# Patient Record
Sex: Female | Born: 1935 | Race: White | Hispanic: No | Smoking: Current every day smoker
Health system: Southern US, Community
[De-identification: ages and names within clinical notes are randomized; demographics above are authoritative.]

## PROBLEM LIST (undated history)

## (undated) DIAGNOSIS — I739 Peripheral vascular disease, unspecified: Secondary | ICD-10-CM

## (undated) DIAGNOSIS — H269 Unspecified cataract: Secondary | ICD-10-CM

## (undated) DIAGNOSIS — H9190 Unspecified hearing loss, unspecified ear: Secondary | ICD-10-CM

## (undated) DIAGNOSIS — J302 Other seasonal allergic rhinitis: Secondary | ICD-10-CM

## (undated) DIAGNOSIS — M1990 Unspecified osteoarthritis, unspecified site: Secondary | ICD-10-CM

## (undated) DIAGNOSIS — I447 Left bundle-branch block, unspecified: Secondary | ICD-10-CM

## (undated) DIAGNOSIS — H3530 Unspecified macular degeneration: Secondary | ICD-10-CM

## (undated) HISTORY — DX: Unspecified cataract: H26.9

## (undated) HISTORY — PX: NO PAST SURGERIES: SHX2092

## (undated) HISTORY — PX: WRIST SURGERY: SHX841

## (undated) HISTORY — DX: Other seasonal allergic rhinitis: J30.2

## (undated) HISTORY — DX: Unspecified osteoarthritis, unspecified site: M19.90

## (undated) HISTORY — DX: Peripheral vascular disease, unspecified: I73.9

---

## 1998-05-13 ENCOUNTER — Ambulatory Visit (HOSPITAL_COMMUNITY): Admission: RE | Admit: 1998-05-13 | Discharge: 1998-05-13 | Payer: Self-pay | Admitting: General Surgery

## 1998-05-19 ENCOUNTER — Other Ambulatory Visit: Admission: RE | Admit: 1998-05-19 | Discharge: 1998-05-19 | Payer: Self-pay | Admitting: General Surgery

## 1998-11-03 ENCOUNTER — Encounter: Payer: Self-pay | Admitting: General Surgery

## 1998-11-03 ENCOUNTER — Ambulatory Visit (HOSPITAL_COMMUNITY): Admission: RE | Admit: 1998-11-03 | Discharge: 1998-11-03 | Payer: Self-pay | Admitting: General Surgery

## 1998-11-29 ENCOUNTER — Other Ambulatory Visit: Admission: RE | Admit: 1998-11-29 | Discharge: 1998-11-29 | Payer: Self-pay | Admitting: Obstetrics and Gynecology

## 1999-11-06 ENCOUNTER — Encounter: Payer: Self-pay | Admitting: General Surgery

## 1999-11-06 ENCOUNTER — Ambulatory Visit (HOSPITAL_COMMUNITY): Admission: RE | Admit: 1999-11-06 | Discharge: 1999-11-06 | Payer: Self-pay | Admitting: General Surgery

## 2000-01-07 ENCOUNTER — Other Ambulatory Visit: Admission: RE | Admit: 2000-01-07 | Discharge: 2000-01-07 | Payer: Self-pay | Admitting: Obstetrics and Gynecology

## 2000-09-27 ENCOUNTER — Other Ambulatory Visit: Admission: RE | Admit: 2000-09-27 | Discharge: 2000-09-27 | Payer: Self-pay | Admitting: General Surgery

## 2000-11-07 ENCOUNTER — Encounter: Payer: Self-pay | Admitting: General Surgery

## 2000-11-07 ENCOUNTER — Ambulatory Visit (HOSPITAL_COMMUNITY): Admission: RE | Admit: 2000-11-07 | Discharge: 2000-11-07 | Payer: Self-pay | Admitting: General Surgery

## 2000-12-05 ENCOUNTER — Other Ambulatory Visit: Admission: RE | Admit: 2000-12-05 | Discharge: 2000-12-05 | Payer: Self-pay | Admitting: Obstetrics and Gynecology

## 2001-11-08 ENCOUNTER — Encounter: Payer: Self-pay | Admitting: Obstetrics and Gynecology

## 2001-11-08 ENCOUNTER — Ambulatory Visit (HOSPITAL_COMMUNITY): Admission: RE | Admit: 2001-11-08 | Discharge: 2001-11-08 | Payer: Self-pay | Admitting: Obstetrics and Gynecology

## 2002-01-18 ENCOUNTER — Other Ambulatory Visit: Admission: RE | Admit: 2002-01-18 | Discharge: 2002-01-18 | Payer: Self-pay | Admitting: Obstetrics and Gynecology

## 2003-02-26 ENCOUNTER — Other Ambulatory Visit: Admission: RE | Admit: 2003-02-26 | Discharge: 2003-02-26 | Payer: Self-pay | Admitting: Obstetrics and Gynecology

## 2003-07-15 ENCOUNTER — Encounter: Admission: RE | Admit: 2003-07-15 | Discharge: 2003-07-15 | Payer: Self-pay | Admitting: Family Medicine

## 2003-07-15 ENCOUNTER — Encounter: Payer: Self-pay | Admitting: Family Medicine

## 2004-04-17 ENCOUNTER — Other Ambulatory Visit: Admission: RE | Admit: 2004-04-17 | Discharge: 2004-04-17 | Payer: Self-pay | Admitting: Obstetrics and Gynecology

## 2004-04-20 ENCOUNTER — Encounter: Admission: RE | Admit: 2004-04-20 | Discharge: 2004-04-20 | Payer: Self-pay | Admitting: Obstetrics and Gynecology

## 2005-04-21 ENCOUNTER — Encounter: Admission: RE | Admit: 2005-04-21 | Discharge: 2005-04-21 | Payer: Self-pay | Admitting: Obstetrics and Gynecology

## 2005-05-06 ENCOUNTER — Other Ambulatory Visit: Admission: RE | Admit: 2005-05-06 | Discharge: 2005-05-06 | Payer: Self-pay | Admitting: Obstetrics and Gynecology

## 2006-04-27 ENCOUNTER — Encounter: Admission: RE | Admit: 2006-04-27 | Discharge: 2006-04-27 | Payer: Self-pay | Admitting: Obstetrics and Gynecology

## 2007-05-01 ENCOUNTER — Encounter: Admission: RE | Admit: 2007-05-01 | Discharge: 2007-05-01 | Payer: Self-pay | Admitting: Family Medicine

## 2008-03-20 ENCOUNTER — Ambulatory Visit: Payer: Self-pay | Admitting: Gastroenterology

## 2008-04-03 ENCOUNTER — Encounter: Payer: Self-pay | Admitting: Gastroenterology

## 2008-04-03 ENCOUNTER — Ambulatory Visit: Payer: Self-pay | Admitting: Gastroenterology

## 2008-04-05 ENCOUNTER — Encounter: Payer: Self-pay | Admitting: Gastroenterology

## 2008-05-02 ENCOUNTER — Encounter: Admission: RE | Admit: 2008-05-02 | Discharge: 2008-05-02 | Payer: Self-pay

## 2008-05-06 ENCOUNTER — Inpatient Hospital Stay (HOSPITAL_COMMUNITY): Admission: EM | Admit: 2008-05-06 | Discharge: 2008-05-09 | Payer: Self-pay | Admitting: Emergency Medicine

## 2009-05-14 ENCOUNTER — Encounter: Admission: RE | Admit: 2009-05-14 | Discharge: 2009-05-14 | Payer: Self-pay | Admitting: Obstetrics and Gynecology

## 2010-05-25 ENCOUNTER — Encounter: Admission: RE | Admit: 2010-05-25 | Discharge: 2010-05-25 | Payer: Self-pay | Admitting: Geriatric Medicine

## 2011-04-13 NOTE — Consult Note (Signed)
Katherine Mcmahon, Katherine Mcmahon              ACCOUNT NO.:  [id number]   MEDICAL RECORD NO.:  [id number]          PATIENT TYPE:  INP   LOCATION:  5019                         FACILITY:  [facility]   PHYSICIAN:  Leonides Grills, M.D.     DATE OF BIRTH:  1927-04-09   DATE OF CONSULTATION:  DATE OF DISCHARGE:                                 CONSULTATION   CHIEF COMPLAINT:  Left hip pain.   HISTORY:  This is a 77 year old female who fell today and had immediate  left hip pain.  She was then taken to [facility] ED where x-rays were  obtained and we were consulted for further evaluation and treatment.  She reports no other areas of pain, fairly isolated to her left hip.  She has had no bowel or bladder incontinence.  No numbness, tingling, or  weakness.   PAST MEDICAL HISTORY:  She has hypercholesterolemia and osteoporosis.  She has acid reflux.   SOCIAL HISTORY:  Smokes a pack per day and lives alone in a household  ambulator.   ALLERGIES:  She has no known drug allergies.   MEDICATIONS:  She takes Fosamax, Lipitor, and a full strength aspirin.   PHYSICAL EXAM:  She is nontender over her bilateral knees, ankles, feet,  wrists, elbows, and shoulders.  Active range of motion of bilateral  knees, ankles, shoulders, elbows, wrists, and hands.  I do not elicit  any pain.  She has palpable dorsalis pedis, posterior tibial pulsation  type to light touch, with distribution equal bilaterally.  We did not  palpate the area of obvious fracture.   X-rays reveal a superior comminuted left-sided pubic ramus fracture,  nondisplaced and probable superior pubic ramus fracture.  Left femur is  without fracture, hip is located.  No evidence of femoral neck fracture  that is obvious.   IMPRESSION:  Left superior pubic ramus fracture with likely inferior  pubic ramus fracture.   PLAN:  Katherine Mcmahon is weightbearing as tolerated with a walker for  assistance.  She is to take Lovenox 30 mg subcu daily for 6 weeks  for  DVT prophylaxis.  She is to have consult PT/OT for gait training ADLs.  She lives alone in a household ambulator and will likely require a  nursing home for skilled nursing facility and this will be determined by  the Social Service.  She is to follow up with [place] at 1 month for x-ray  evaluation.  If there is any questions or concerns, feel free to call  us.  We went over this with the patient as well as the patient's family  today in great detail and all questions were encouraged and answered.      Leonides Grills, M.D.  Electronically Signed    PB/MEDQ  D:  05/06/2008  T:  05/07/2008  Job:  [id number]

## 2011-04-13 NOTE — Discharge Summary (Signed)
NAMECHARMEL, PRONOVOST              ACCOUNT NO.:  [id number]   MEDICAL RECORD NO.:  [id number]          PATIENT TYPE:  INP   LOCATION:  5019                         FACILITY:  [facility]   PHYSICIAN:  Kela Millin, M.D.DATE OF BIRTH:  [birth year]   DATE OF ADMISSION:  05/06/2008  DATE OF DISCHARGE:  05/09/2008                               DISCHARGE SUMMARY   DISCHARGE DIAGNOSES:  1. Comminuted left superior pubic ramus fracture - status post fall.  2. Hyperlipidemia.  3. Osteoporosis.  4. Gastroesophageal reflux disease.  5. Urinary tract infection.   CONSULTATIONS:  Orthopedics, Dr. Lestine Box.   PERTINENT STUDIES:  Pelvic x-rays:  Comminuted left superior pubic ramus  fracture.  Otherwise no acute fracture or dislocation identified around  the left femur.   BRIEF HISTORY:  The patient is a 77 year old white female with the above-  listed medical problems, who presented with complaints of pain status  post fall.  She indicated that she was walking uphill in her backyard on  the day prior to admission when she lost her balance and fell on her  left side.  She stated that she was able to get up and walk with some  pain but was able to get back into the house.  She denied syncope,  dizziness, chest pain, shortness of breath, and no palpitations.  She  also denied nausea or vomiting, and no diarrhea.  When she woke up the  next morning, she was unable to get out of bed secondary to the pain,  and was also unable to bear any weight as a result, and so she came to  the ER.   Please see the full admission history and physical dictated on May 06, 2008 by Dr. Janee Morn for the details of the admission physical exam as  well as the laboratory data.   HOSPITAL COURSE:  1. Comminuted left superior pubic ramus fracture.  Upon admission,      orthopedics was consulted, and Dr. Lestine Box saw the patient and      recommended inpatient admission for pain management and PT and OT.      Physical  therapy was consulted and followed the patient during her      hospital stay, and short-term skilled nursing facility has been      recommended for further rehabilitation since he it was noted on      admission that the patient lives alone.  Her pain has been      controlled with oral narcotics which she is to continue upon      discharge.  She is to follow up with Dr. Lestine Box in 1 month.  2. Hyperlipidemia.  The patient was maintained on her outpatient      medications during her hospital stay.  3. Urinary tract infection.  The patient's urinalysis was consistent      with a UTI, and she was empirically started on oral antibiotics.      The urine cultures grew group B Strep (Streptococcus agalactiae).      She will continue on oral antibiotics upon discharge to complete  the treatment course.  She has remained afebrile and      hemodynamically stable and also tolerating p.o. well.  Her last      white cell count prior to discharge was 8.8.  4. Osteoporosis.  She is to continue her Fosamax upon discharge.  5. History of gastroesophageal reflux disease.  The patient will be      discharged on Prilosec.   DISCHARGE MEDICATIONS:  1. Fosamax 70 mg q.week on Mondays.  2. Lipitor 40 mg daily.  3. Aspirin 325 mg daily.  4. Prilosec 20 mg daily.  5. Centrum Silver 1 tablet daily.  6. Fish oil 1000 mg daily.  7. Calcium 600 mg p.o. b.i.d.  8. Vitamin D p.o. daily.  9. Cipro 250 mg 1 p.o. b.i.d. x2 more days.  10.Lovenox 30 mg subcutaneously daily for 6 weeks for DVT prophylaxis.  11.Oxycodone 5 mg p.o. q.4-6 h. p.r.n.  12.Tylenol 650 mg p.o. q.6 h. p.r.n.   SPECIAL INSTRUCTIONS FROM ORTHOPEDICS:  Weightbearing as tolerated on  left lower extremity.  Patient to continue PT, OT at nursing home for  gait training, ADLs, and weightbearing as tolerated with a walker.   FOLLOW-UP CARE:  1. Follow up with Dr. Lestine Box at his office 1 month after discharge      from the hospital; call  [phone] for appointment.  2. The patient is to follow up with her primary care physician, Dr.      Arnoldo Morale, following discharge from the nursing home as scheduled.   DISCHARGE CONDITION:  Improved/stable.      Kela Millin, M.D.  Electronically Signed     ACV/MEDQ  D:  05/09/2008  T:  05/09/2008  Job:  [id number]   cc:   Leonides Grills, M.D.  Dr. Vergia Alberts

## 2011-04-13 NOTE — H&P (Signed)
[name], Mcmahon              ACCOUNT NO.:  [id number]   MEDICAL RECORD NO.:  [id number]          PATIENT TYPE:  INP   LOCATION:  5019                         FACILITY:  [facility]   PHYSICIAN:  Ramiro Harvest, MD    DATE OF BIRTH:  1927/05/23   DATE OF ADMISSION:  05/06/2008  DATE OF DISCHARGE:                              HISTORY & PHYSICAL   PRIMARY CARE PHYSICIAN:  Dr. Arnoldo Morale.   HISTORY OF PRESENT ILLNESS:  Katherine Mcmahon is a pleasant 77 year old  white female with history of hyperlipidemia, osteoporosis,  gastroesophageal reflux disease, who presents to the ED with the pelvic  fracture.  Per patient, the patient states she was walking uphill in her  backyard 1 day prior to admission when she lost her balance and fell on  her left side.  The patient states that she was able to get up and  ambulate with mild pain and was able to ambulate back home.  The patient  denied any syncope.  No dizziness, no chest pain, no tripping.  No  shortness of breath, no palpitations.  No nausea, no vomiting, no  headache, no diarrhea, no constipation, no neurological symptoms, no  melena, no hematochezia, no hematemesis.  No cough, no fever, no chills,  no other associated symptoms.  The patient stated that she woke up this  morning and was unable to get out of bed secondary to pain, was unable  to bear weight and had excruciating pain on ambulation or movement.  The  patient called her daughter, who brought her to the emergency room.  We  were called to admit the patient for further evaluation and management.  In the ED, plain films of the left femur and pelvis showed a comminuted  left superior ramus fracture, ortho was called and will consult on the  patient.  We were called to admit the patient for further evaluation and  management.   ALLERGIES:  No known drug allergies.   PAST MEDICAL HISTORY:  1. Hyperlipidemia.  2. Osteoporosis.  3. Gastroesophageal reflux disease.   HOME  MEDICATIONS:  1. Fosamax 70 mg q. weekly on Mondays.  2. Lipitor 40 mg daily.  3. Aspirin 325 mg daily.  4. Centrum Silver daily.  5. Fish oil 1000 mg daily.  6. Calcium 600 mg b.i.d.  7. Vitamin D 3 daily.  8. Glucosamine daily.   SOCIAL HISTORY:  The patient is positive for tobacco abuse, smokes a  pack a day, has smoked for a total of 58 years.  No alcohol use.  No IV  drug use.  The patient lives in [place] by herself, is widowed, has 2  daughters all of whom are healthy and 3 grandchildren, all of whom are  healthy.   FAMILY HISTORY:  Mother deceased at age 39 from colon cancer.  Father  deceased at age 10 or 19 from acute MI.  Brother deceased at age 34 from  abdominal cancer.  Sister deceased at age 66 from bone cancer and was  status post mastectomy.   REVIEW OF SYSTEMS:  As per HPI, otherwise negative.   PHYSICAL  EXAMINATION:  VITAL SIGNS:  Temperature 97.2, blood pressure  113/72, pulse of 91, respiratory rate 16, and saturating 97% on room  air.  GENERAL:  The patient is a pleasant elderly female in no apparent  distress.  HEENT:  Normocephalic and atraumatic.  Pupils equal, round and reactive  to light.  Extraocular movements intact.  Oropharynx is clear and moist.  No lesions, no exudates.  NECK:  Supple.  No lymphadenopathy.  RESPIRATORY:  Lungs are clear to auscultation bilaterally.  No wheezes.  No rhonchi.  CARDIOVASCULAR:  Regular rate and rhythm.  No murmurs, rubs, or gallops.  ABDOMEN:  Soft, nontender, and nondistended.  Positive bowel sounds.  EXTREMITIES:  No clubbing, cyanosis, or edema.  Left groin tender to  palpation.  2+ dorsalis pedis pulses bilaterally.  No shortening of the  limb.  NEUROLOGICAL:  The patient is alert and oriented x3.  Cranial nerves II  through XII are grossly intact.  No focal deficits.  Gait not tested  secondary to her safety.   LABORATORY DATA:  Plain films of the left femur and pelvis showed a  comminuted left  superior pubic ramus fracture.  Otherwise, no acute  fracture or dislocation identified about the left femur.   ASSESSMENT AND PLAN:  Katherine Mcmahon is a 77 year old female with  history of hyperlipidemia and tobacco abuse, osteoporosis,  gastroesophageal reflux disease, who presents to the emergency  department with a comminuted pubic ramus fracture.  1. Comminuted left superior pubic ramus fracture.  We will admit the      patient to a regular bed.  We will check a comprehensive metabolic      profile, check a CBC, check a PT/PTT, INR, check EKG, check an UA      with cultures and sensitivities.  We will administer supportive      care, pain management, physical therapy and occupational therapy.      We will defer activities/weightbearing status through ortho and      also to consult on the patient for further evaluation and      management.  2. Hyperlipidemia.  Continue home dose Lipitor.  3. Osteoporosis.  4. Gastroesophageal reflux disease.  Protonix.  5. Prophylaxis.  Protonix for gastrointestinal prophylaxis.  Lovenox      for deep venous thrombosis prophylaxis.   It has been a pleasure taking care of Katherine Mcmahon.      Ramiro Harvest, MD  Electronically Signed     DT/MEDQ  D:  05/06/2008  T:  05/07/2008  Job:  [id number]

## 2011-04-22 ENCOUNTER — Other Ambulatory Visit: Payer: Self-pay | Admitting: Geriatric Medicine

## 2011-04-22 DIAGNOSIS — Z1231 Encounter for screening mammogram for malignant neoplasm of breast: Secondary | ICD-10-CM

## 2011-06-07 ENCOUNTER — Ambulatory Visit
Admission: RE | Admit: 2011-06-07 | Discharge: 2011-06-07 | Disposition: A | Discharge status: Home / Self Care | Payer: Medicare Other | Source: Ambulatory Visit | Attending: Geriatric Medicine | Admitting: Geriatric Medicine

## 2011-06-07 DIAGNOSIS — Z1231 Encounter for screening mammogram for malignant neoplasm of breast: Secondary | ICD-10-CM

## 2011-08-26 LAB — BASIC METABOLIC PANEL
BUN: 10
BUN: 14
CO2: 26
CO2: 28
Chloride: 110
Chloride: 112
Creatinine, Ser: 0.83
Creatinine, Ser: 0.9
GFR calc Af Amer: >60
GFR calc non Af Amer: >60
Potassium: 4
Potassium: 4.1
Sodium: 142

## 2011-08-26 LAB — URINALYSIS, ROUTINE W REFLEX MICROSCOPIC
Glucose, UA: NEGATIVE
Hgb urine dipstick: NEGATIVE
Ketones, ur: NEGATIVE
Nitrite: NEGATIVE
Urobilinogen, UA: 0.2
pH: 5.5

## 2011-08-26 LAB — DIFFERENTIAL
Basophils Absolute: 0
Basophils Relative: 1
Eosinophils Absolute: 0.1
Lymphs Abs: 1.6
Neutrophils Relative %: 74

## 2011-08-26 LAB — CBC
HCT: 36.8
HCT: 39.4
MCHC: 35.2
MCV: 90.2
RDW: 14.1
RDW: 14.1

## 2011-08-26 LAB — URINE MICROSCOPIC-ADD ON

## 2011-08-26 LAB — COMPREHENSIVE METABOLIC PANEL
AST: 26
Alkaline Phosphatase: 56
CO2: 26
Calcium: 8.7
Creatinine, Ser: 1
GFR calc Af Amer: >60
Glucose, Bld: 164 — ABNORMAL HIGH
Potassium: 3.7
Sodium: 141
Total Bilirubin: 1.1

## 2011-08-26 LAB — URINE CULTURE

## 2012-05-10 ENCOUNTER — Other Ambulatory Visit: Payer: Self-pay | Admitting: Geriatric Medicine

## 2012-05-10 DIAGNOSIS — Z1231 Encounter for screening mammogram for malignant neoplasm of breast: Secondary | ICD-10-CM

## 2012-06-07 ENCOUNTER — Ambulatory Visit
Admission: RE | Admit: 2012-06-07 | Discharge: 2012-06-07 | Disposition: A | Discharge status: Home / Self Care | Payer: Medicare Other | Source: Ambulatory Visit | Attending: Geriatric Medicine | Admitting: Geriatric Medicine

## 2012-06-07 DIAGNOSIS — Z1231 Encounter for screening mammogram for malignant neoplasm of breast: Secondary | ICD-10-CM

## 2013-03-21 ENCOUNTER — Ambulatory Visit (INDEPENDENT_AMBULATORY_CARE_PROVIDER_SITE_OTHER): Payer: Medicare Other | Admitting: Family Medicine

## 2013-03-21 ENCOUNTER — Encounter (INDEPENDENT_AMBULATORY_CARE_PROVIDER_SITE_OTHER): Payer: Self-pay | Admitting: General Surgery

## 2013-03-21 ENCOUNTER — Ambulatory Visit: Payer: Medicare Other

## 2013-03-21 ENCOUNTER — Ambulatory Visit (INDEPENDENT_AMBULATORY_CARE_PROVIDER_SITE_OTHER): Payer: Medicare Other | Admitting: General Surgery

## 2013-03-21 VITALS — BP 160/78 | HR 76 | Temp 97.1°F | Resp 16 | Ht 63.0 in | Wt 131.0 lb

## 2013-03-21 VITALS — BP 152/81 | HR 81 | Temp 98.0°F | Resp 18 | Ht 63.0 in | Wt 133.0 lb

## 2013-03-21 DIAGNOSIS — S41111A Laceration without foreign body of right upper arm, initial encounter: Secondary | ICD-10-CM

## 2013-03-21 DIAGNOSIS — M25579 Pain in unspecified ankle and joints of unspecified foot: Secondary | ICD-10-CM

## 2013-03-21 DIAGNOSIS — S51809A Unspecified open wound of unspecified forearm, initial encounter: Secondary | ICD-10-CM

## 2013-03-21 DIAGNOSIS — R079 Chest pain, unspecified: Secondary | ICD-10-CM

## 2013-03-21 DIAGNOSIS — M25571 Pain in right ankle and joints of right foot: Secondary | ICD-10-CM

## 2013-03-21 DIAGNOSIS — Z23 Encounter for immunization: Secondary | ICD-10-CM

## 2013-03-21 DIAGNOSIS — R0781 Pleurodynia: Secondary | ICD-10-CM

## 2013-03-21 DIAGNOSIS — S41109A Unspecified open wound of unspecified upper arm, initial encounter: Secondary | ICD-10-CM

## 2013-03-21 MED ORDER — TETANUS-DIPHTHERIA TOXOIDS TD 2-2 LF/0.5ML IM SUSP: 0.5000 mL | Freq: Once | INTRAMUSCULAR | Status: DC

## 2013-03-21 MED ORDER — HYDROCODONE-ACETAMINOPHEN 5-325 MG PO TABS: ORAL_TABLET | ORAL | Status: DC

## 2013-03-21 NOTE — Patient Instructions (Signed)
Remove dressing on Friday and wash gently. Replace antibiotic ointment and dressing everyday

## 2013-03-21 NOTE — Progress Notes (Addendum)
[facility] [address] [place] [address] [phone]  Date:  03/21/2013   Name:  Katherine Mcmahon   DOB:  May 02, 1927   MRN:  [id number]  PCP:  No primary provider on file.    Chief Complaint: Arm Pain, Abdominal Pain and Ankle Pain   History of Present Illness:  Katherine Mcmahon is a 79 y.o. very pleasant female patient who presents with the following:  Yesterday afternoon she caught her foot when getting off the couch- fell and hit her right arm and right ribs on a coffee table. She cut her right arm on the edge of the table.  This is the first time she has sought care for her injury.  She is generally healthy and only takes aspirin and occuvite,  She is not on any other blood thinners.  Her daughter bandaged her right arm, but the laceration is quite severe.  She also has bruising and some swelling of her right foot, but only notes pain with direct pressure on the area.  She has bruising and tenderness in her right lower ribs, but does not note abdominal pain.  She has been eating normally since her fall  She did not hit her head or have any LOC.    Her last tetanus shot was a long time ago  She does recall a past history of right foot fracture when questioned about appearance of old fracture on her x-ray  There is no problem list on file for this patient.   Past Medical History  Diagnosis Date  . Cataract     History reviewed. No pertinent past surgical history.  History  Substance Use Topics  . Smoking status: Current Every Day Smoker  . Smokeless tobacco: Not on file  . Alcohol Use: No    Family History  Problem Relation Age of Onset  . Cancer Mother     colon  . Heart attack Father   . Cancer Sister     bone and breast    No Known Allergies  Medication list has been reviewed and updated.  No current outpatient prescriptions on file prior to visit.   No current facility-administered medications on file prior to visit.     Review of Systems:  As per HPI- otherwise negative.   Physical Examination: Filed Vitals:   03/21/13 1106  BP: 152/81  Pulse: 81  Temp: 98 F (36.7 C)  Resp: 18   Filed Vitals:   03/21/13 1106  Height: 5\' 3"  (1.6 m)  Weight: 133 lb (60.328 kg)   Body mass index is 23.57 kg/(m^2). Ideal Body Weight: Weight in (lb) to have BMI = 25: 140.8  GEN: WDWN, NAD, Non-toxic, A & O x 3, hard of hearing HEENT: Atraumatic, Normocephalic. Neck supple. No masses, No LAD.  Bilateral TM wnl, oropharynx normal.  PEERL,EOMI.   No cervical spine tenderness, normal cervical ROM Ears and Nose: No external deformity. CV: RRR, No M/G/R. No JVD. No thrill. No extra heart sounds. PULM: CTA B, no wheezes, crackles, rhonchi. No retractions. No resp. distress. No accessory muscle use. ABD: S, NT, ND, +BS. No rebound. No HSM. She is tender and has bruising over her lower right ribs.  However, there is no tenderness in the abdomen Right forearm: there is a very large, gaping wound extending from the elbow onto the forearm.  It is about 7 inches in length and is through the fatty tissue into the muscle She does have normal  ROM, strength, and sensation of her hand and wrist as well as normal cap refill and normal pulses Right ankle/ foot: she has tenderness and mild swelling/ bruising in the dorsum of the foot over the 3rd and 4th MT.  Able to bear weight EXTR: No c/c/e NEURO Normal gait.  PSYCH: Normally interactive. Conversant. Not depressed or anxious appearing.  Calm demeanor.   [facility] reading (PRIMARY) by  Dr. Patsy Lager. Right foot:apparent distant fracture of 3rd MT, otherwise negative Right ankle: negative for fracture Right ribs: no definite fracture noted Right forearm/ elbow: negative  RIGHT ANKLE - COMPLETE 3+ VIEW  Comparison: None.  Findings: Ankle mortise congruent. Talar dome intact. Lateral malleolar soft tissue swelling is present. Ankle alignment is anatomic.  IMPRESSION: No  acute osseous abnormality.  RIGHT FOOT COMPLETE - 3+ VIEW  Comparison: None.  Findings: There is no acute fracture or dislocation. Old healed fractures of the second and third metatarsals. Slight osteoarthritis and hallux valgus deformity and bunion formation at the first metatarsal phalangeal joint.  IMPRESSION: No acute abnormalities.  Clinically significant discrepancy from primary report, if provided: None  RIGHT ELBOW - COMPLETE 3+ VIEW  Comparison: None.  Findings: There is no fracture, dislocation, or joint effusion.  IMPRESSION: Normal exam.  Clinically significant discrepancy from primary report, if provided: None   RIGHT FOREARM - 2 VIEW  Comparison: None.  Findings: There is no acute fracture or dislocation. There is an old avulsion of the ulnar styloid as well as an old healed fracture of the distal radius. Arthritic changes in the radiocarpal joint and at the first carpal metacarpal joint.  IMPRESSION: No acute abnormalities of the radius and ulna.  Clinically significant discrepancy from primary report, if provided: None   RIGHT RIBS AND CHEST - 3+ VIEW   Comparison: None.  Findings: There are fractures of the anterior aspects of the right  fifth and sixth ribs and slight deformity of the anterior aspect of  the eighth rib which is probably a nondisplaced fracture.  The patient has multiple old left rib fractures.  Heart size and vascularity are normal. Lungs are clear. No  pneumothorax or effusion or lung contusion.   IMPRESSION: Acute fractures of the anterior aspects of the right  fifth, sixth, and probably eighth ribs.  Clinically significant discrepancy from primary report, if  provided: None   Assessment and Plan: Rib pain on right side - Plan: DG Ribs Unilateral W/Chest Right  Ankle pain, right - Plan: DG Ankle Complete Right, DG Foot Complete Right  Laceration of right arm with complication, initial encounter - Plan: DG Elbow  Complete Right, DG Forearm Right, Td vaccine greater than or equal to 7yo preservative free IM, HYDROcodone-acetaminophen (NORCO/VICODIN) 5-325 MG per tablet, DISCONTINUED: diptheria-tetanus toxoids (DECAVAC) 2-2 LF/0.5ML injection  79 year old woman with a fall yesterday- she has a severe laceration of her right forearm.  [facility] has agreed to see her this afternoon- appreciate their care of this nice patient.   Ankle sprain, mild. No apparent acute fracture.  She has a walker to use as needed. Ice as needed Rib contusion.  No apparent abdominal injury or fracture.  She is to seek care if any worsening or vomiting.    Vicodin to use PRN pain, caution regarding sedation.  Recommend that she use this only if pain not controlled with OTC agents  Signed Abbe Amsterdam, MD  Received x-ray overread reports- she does indeed have rib fractures.  As this injury is now 24 hours  old she is at lower risk for complication.  However, we need to be sure that she is aware of risks of pneumothorax or hemothorax, and that she seeks care if any complication arises.  Called both numbers available on her chart but was not able to reach her.  Alerted [facility] that I am trying to reach her and asked them to please have her call me.  Gave cell number  Her daughter Rosey Bath did call me back around 5pm and we discussed this issue.  Went over warning signs in detail- if any worsening pain, SOB, difficulty breathing Oreta needs to seek care right away.  It is also important that she take deep breaths several times a day to help prevent atelectasis and pneumonia

## 2013-03-21 NOTE — Patient Instructions (Addendum)
Please proceed to [facility], located at  Address: [address] [place], [place] [address] [phone]  She will be seen at 3:15

## 2013-03-22 NOTE — Progress Notes (Signed)
Subjective:     Patient ID: Katherine Mcmahon, female   DOB: 1927-09-05, 79 y.o.   MRN: [id number]  HPI The patient is an 79 year old white female who we are asked to see in consultation by Dr. Dallas Schimke to evaluate her for a laceration to her right forearm. The patient is a 79 year old white female who turned her ankle yesterday when getting up from the couch and fell into the coffee table. She suffered a laceration to her right forearm. She bandaged this up and then went to an urgent care today to have it looked at. She does complain of pain at the site. She is able to use her arm.  Review of Systems  Constitutional: Negative.   HENT: Negative.   Eyes: Negative.   Respiratory: Negative.   Cardiovascular: Negative.   Gastrointestinal: Negative.   Endocrine: Negative.   Genitourinary: Negative.   Musculoskeletal: Negative.   Skin: Negative.   Allergic/Immunologic: Negative.   Neurological: Negative.   Hematological: Negative.   Psychiatric/Behavioral: Negative.        Objective:   Physical Exam  Constitutional: She is oriented to person, place, and time. She appears well-developed and well-nourished.  HENT:  Head: Normocephalic and atraumatic.  Eyes: Conjunctivae and EOM are normal. Pupils are equal, round, and reactive to light.  Neck: Normal range of motion. Neck supple.  Cardiovascular: Normal rate, regular rhythm and normal heart sounds.   Pulmonary/Chest: Effort normal and breath sounds normal.  Abdominal: Soft. Bowel sounds are normal.  Musculoskeletal: Normal range of motion.  There is a large curvilinear laceration in the right upper forearm that creates a small flap from the upper portion of skin. There is no skin necrosis. There is no involvement of the muscle. The wound is very clean.  Neurological: She is alert and oriented to person, place, and time.  Skin: Skin is warm and dry.  Psychiatric: She has a normal mood and affect. Her behavior is normal.        Assessment:     The patient fell yesterday and suffered a large laceration to her right forearm     Plan:     The arm was prepped with Betadine and infiltrated with 1% lidocaine. The wound was then irrigated with copious amounts of saline. The wound was then closed with interrupted 5-0 nylon stitches. Once the wound was closed the stitch line was covered and antibiotic ointment and wrapped with a sterile dressing. Wound care instructions were given to the patient and her family. We will plan to see her back in one week to check the wound.

## 2013-03-26 ENCOUNTER — Encounter (INDEPENDENT_AMBULATORY_CARE_PROVIDER_SITE_OTHER): Payer: Self-pay | Admitting: General Surgery

## 2013-03-26 ENCOUNTER — Ambulatory Visit (INDEPENDENT_AMBULATORY_CARE_PROVIDER_SITE_OTHER): Payer: Medicare Other | Admitting: General Surgery

## 2013-03-26 VITALS — BP 124/74 | HR 78 | Resp 18 | Ht 63.0 in | Wt 133.0 lb

## 2013-03-26 DIAGNOSIS — S41111D Laceration without foreign body of right upper arm, subsequent encounter: Secondary | ICD-10-CM

## 2013-03-26 DIAGNOSIS — Z5189 Encounter for other specified aftercare: Secondary | ICD-10-CM

## 2013-03-26 NOTE — Progress Notes (Signed)
Subjective:     Patient ID: Katherine Mcmahon, female   DOB: 06/16/1927, 79 y.o.   MRN: [id number]  HPI The patient is a 79 year old white female who is one-week status post repair of a complex laceration to her right forearm. She still has some discomfort associated with it. She reports some drainage. She denies any fevers or chills.  Review of Systems     Objective:   Physical Exam On exam the laceration to right forearm seems to be healing nicely. There is still some bruising to the soft tissue but no sign of infection. There is no drainage today. The skin appears to be viable    Assessment:     1 week status post repair of complex laceration of the right forearm     Plan:     At this point I think she can begin showering. She will redress the wound with antibiotic ointment and gauze after showering. We will plan to see her back in one week to remove her stitches.

## 2013-03-26 NOTE — Patient Instructions (Signed)
May shower. [place] with antibiotic ointment and gauze each day

## 2013-04-04 ENCOUNTER — Encounter (INDEPENDENT_AMBULATORY_CARE_PROVIDER_SITE_OTHER): Payer: Self-pay | Admitting: General Surgery

## 2013-04-04 ENCOUNTER — Ambulatory Visit (INDEPENDENT_AMBULATORY_CARE_PROVIDER_SITE_OTHER): Payer: Medicare Other | Admitting: General Surgery

## 2013-04-04 VITALS — BP 128/74 | HR 90 | Temp 98.3°F | Resp 18 | Ht 63.0 in | Wt 131.0 lb

## 2013-04-04 DIAGNOSIS — S41111D Laceration without foreign body of right upper arm, subsequent encounter: Secondary | ICD-10-CM

## 2013-04-04 DIAGNOSIS — S51809A Unspecified open wound of unspecified forearm, initial encounter: Secondary | ICD-10-CM

## 2013-04-04 NOTE — Progress Notes (Signed)
Subjective:     Patient ID: Katherine Mcmahon, female   DOB: [birth year], 79 y.o.   MRN: [id number]  HPI The patient is an 79 year old white female who is about 2 weeks status post repair of a large laceration to her right forearm. She is doing well and denies any pain. She is using that arm and hand very well.  Review of Systems  Constitutional: Negative.   Eyes: Negative.   Respiratory: Negative.   Cardiovascular: Negative.   Gastrointestinal: Negative.   Endocrine: Negative.   Genitourinary: Negative.   Musculoskeletal: Negative.   Skin: Negative.   Allergic/Immunologic: Negative.   Neurological: Negative.   Hematological: Negative.   Psychiatric/Behavioral: Negative.        Objective:   Physical Exam  Constitutional: She is oriented to person, place, and time. She appears well-developed and well-nourished.  HENT:  Head: Normocephalic and atraumatic.  Eyes: Conjunctivae and EOM are normal. Pupils are equal, round, and reactive to light.  Neck: Normal range of motion. Neck supple.  Cardiovascular: Normal rate, regular rhythm and normal heart sounds.   Pulmonary/Chest: Effort normal and breath sounds normal.  Abdominal: Soft. Bowel sounds are normal.  Musculoskeletal: Normal range of motion.  The laceration to the right arm is healing nicely with no sign of infection. The stitches were removed today without difficulty.  Neurological: She is alert and oriented to person, place, and time.  Skin: Skin is warm and dry.  Psychiatric: She has a normal mood and affect. Her behavior is normal.       Assessment:     The patient is 2 weeks status post repair of a large laceration to the right forearm     Plan:     At this point she will continue to keep the area clean and covered. We will see her back again in about 3 weeks to check the wound

## 2013-04-04 NOTE — Patient Instructions (Signed)
Continue to keep area clean and covered

## 2013-04-24 ENCOUNTER — Encounter (INDEPENDENT_AMBULATORY_CARE_PROVIDER_SITE_OTHER): Payer: Medicare Other | Admitting: General Surgery

## 2013-04-25 ENCOUNTER — Ambulatory Visit (INDEPENDENT_AMBULATORY_CARE_PROVIDER_SITE_OTHER): Payer: Medicare Other | Admitting: General Surgery

## 2013-04-25 ENCOUNTER — Encounter (INDEPENDENT_AMBULATORY_CARE_PROVIDER_SITE_OTHER): Payer: Self-pay | Admitting: General Surgery

## 2013-04-25 VITALS — BP 112/84 | HR 98 | Temp 97.4°F | Resp 18 | Ht 63.0 in | Wt 132.6 lb

## 2013-04-25 DIAGNOSIS — Z5189 Encounter for other specified aftercare: Secondary | ICD-10-CM

## 2013-04-25 DIAGNOSIS — S41111D Laceration without foreign body of right upper arm, subsequent encounter: Secondary | ICD-10-CM

## 2013-04-25 NOTE — Patient Instructions (Addendum)
Do not need to use antibiotic ointment anymore May return to normal activities

## 2013-04-25 NOTE — Progress Notes (Signed)
Subjective:     Patient ID: Katherine Mcmahon, female   DOB: 1927/10/03, 79 y.o.   MRN: [id number]  HPI The patient is a 79 year old white female who is several weeks status post repair of a complex laceration to her right forearm. She has done well and has no complaints today.  Review of Systems     Objective:   Physical Exam On exam her laceration has healed nicely. She has good function and sensation in her hand    Assessment:     Status post repair of laceration to the right forearm     Plan:     At this point she can stop using the antibiotic ointment. She can return to her normal activities without restriction. We will plan to see her back on a when necessary basis

## 2013-05-03 ENCOUNTER — Other Ambulatory Visit: Payer: Self-pay

## 2013-05-03 DIAGNOSIS — Z1231 Encounter for screening mammogram for malignant neoplasm of breast: Secondary | ICD-10-CM

## 2013-06-12 ENCOUNTER — Ambulatory Visit
Admission: RE | Admit: 2013-06-12 | Discharge: 2013-06-12 | Disposition: A | Discharge status: Home / Self Care | Payer: Medicare Other | Source: Ambulatory Visit

## 2013-06-12 DIAGNOSIS — Z1231 Encounter for screening mammogram for malignant neoplasm of breast: Secondary | ICD-10-CM

## 2014-05-08 ENCOUNTER — Other Ambulatory Visit: Payer: Self-pay

## 2014-05-08 DIAGNOSIS — Z1231 Encounter for screening mammogram for malignant neoplasm of breast: Secondary | ICD-10-CM

## 2014-06-13 ENCOUNTER — Ambulatory Visit
Admission: RE | Admit: 2014-06-13 | Discharge: 2014-06-13 | Disposition: A | Discharge status: Home / Self Care | Payer: Medicare Other | Source: Ambulatory Visit

## 2014-06-13 DIAGNOSIS — Z1231 Encounter for screening mammogram for malignant neoplasm of breast: Secondary | ICD-10-CM

## 2014-07-04 ENCOUNTER — Encounter (HOSPITAL_COMMUNITY): Payer: Self-pay | Admitting: Emergency Medicine

## 2014-07-04 ENCOUNTER — Inpatient Hospital Stay (HOSPITAL_COMMUNITY)
Admission: EM | Admit: 2014-07-04 | Discharge: 2014-07-06 | DRG: 206 | Disposition: A | Discharge status: Home Health | Payer: Medicare Other | Attending: General Surgery | Admitting: General Surgery

## 2014-07-04 ENCOUNTER — Emergency Department (HOSPITAL_COMMUNITY): Payer: Medicare Other

## 2014-07-04 DIAGNOSIS — J942 Hemothorax: Secondary | ICD-10-CM

## 2014-07-04 DIAGNOSIS — S27329A Contusion of lung, unspecified, initial encounter: Secondary | ICD-10-CM | POA: Diagnosis present

## 2014-07-04 DIAGNOSIS — S2249XA Multiple fractures of ribs, unspecified side, initial encounter for closed fracture: Principal | ICD-10-CM | POA: Diagnosis present

## 2014-07-04 DIAGNOSIS — F17200 Nicotine dependence, unspecified, uncomplicated: Secondary | ICD-10-CM | POA: Diagnosis present

## 2014-07-04 DIAGNOSIS — W010XXA Fall on same level from slipping, tripping and stumbling without subsequent striking against object, initial encounter: Secondary | ICD-10-CM | POA: Diagnosis present

## 2014-07-04 DIAGNOSIS — Y92009 Unspecified place in unspecified non-institutional (private) residence as the place of occurrence of the external cause: Secondary | ICD-10-CM

## 2014-07-04 DIAGNOSIS — H269 Unspecified cataract: Secondary | ICD-10-CM | POA: Diagnosis present

## 2014-07-04 DIAGNOSIS — Z79899 Other long term (current) drug therapy: Secondary | ICD-10-CM

## 2014-07-04 DIAGNOSIS — W19XXXA Unspecified fall, initial encounter: Secondary | ICD-10-CM

## 2014-07-04 DIAGNOSIS — S2231XA Fracture of one rib, right side, initial encounter for closed fracture: Secondary | ICD-10-CM

## 2014-07-04 DIAGNOSIS — Z7982 Long term (current) use of aspirin: Secondary | ICD-10-CM

## 2014-07-04 LAB — COMPREHENSIVE METABOLIC PANEL
ALT: 15 U/L (ref 0–35)
ANION GAP: 16 — AB (ref 5–15)
AST: 35 U/L (ref 0–37)
Albumin: 3.8 g/dL (ref 3.5–5.2)
Alkaline Phosphatase: 63 U/L (ref 39–117)
BUN: 23 mg/dL (ref 6–23)
CHLORIDE: 105 meq/L (ref 96–112)
CO2: 19 meq/L (ref 19–32)
Calcium: 9 mg/dL (ref 8.4–10.5)
Creatinine, Ser: 0.75 mg/dL (ref 0.50–1.10)
GFR calc Af Amer: 87 mL/min — ABNORMAL LOW (ref >90)
GFR calc non Af Amer: 75 mL/min — ABNORMAL LOW (ref >90)
Glucose, Bld: 98 mg/dL (ref 70–99)
POTASSIUM: 5.1 meq/L (ref 3.7–5.3)
SODIUM: 140 meq/L (ref 137–147)
Total Bilirubin: 0.5 mg/dL (ref 0.3–1.2)
Total Protein: 7.7 g/dL (ref 6.0–8.3)

## 2014-07-04 LAB — CBC
HCT: 42.9 % (ref 36.0–46.0)
Hemoglobin: 14.3 g/dL (ref 12.0–15.0)
MCH: 30.2 pg (ref 26.0–34.0)
MCHC: 33.3 g/dL (ref 30.0–36.0)
MCV: 90.7 fL (ref 78.0–100.0)
Platelets: 246 10*3/uL (ref 150–400)
RBC: 4.73 MIL/uL (ref 3.87–5.11)
RDW: 14.1 % (ref 11.5–15.5)
WBC: 8.8 10*3/uL (ref 4.0–10.5)

## 2014-07-04 LAB — LIPASE, BLOOD: LIPASE: 47 U/L (ref 11–59)

## 2014-07-04 LAB — TROPONIN I: Troponin I: <0.3 ng/mL (ref <0.30)

## 2014-07-04 MED ORDER — FENTANYL CITRATE 0.05 MG/ML IJ SOLN
25.0000 ug | Freq: Once | INTRAMUSCULAR | Status: AC
Start: 2014-07-04 — End: 2014-07-04
  Administered 2014-07-04: 25 ug via INTRAVENOUS
  Filled 2014-07-04: qty 2

## 2014-07-04 MED ORDER — SODIUM CHLORIDE 0.9 % IV BOLUS (SEPSIS)
500.0000 mL | Freq: Once | INTRAVENOUS | Status: AC
  Administered 2014-07-04: 500 mL via INTRAVENOUS

## 2014-07-04 MED ORDER — MORPHINE SULFATE 2 MG/ML IJ SOLN
2.0000 mg | Freq: Once | INTRAMUSCULAR | Status: DC
Start: 2014-07-04 — End: 2014-07-04
  Filled 2014-07-04: qty 1

## 2014-07-04 MED ORDER — FENTANYL CITRATE 0.05 MG/ML IJ SOLN
50.0000 ug | Freq: Once | INTRAMUSCULAR | Status: DC
  Filled 2014-07-04: qty 2

## 2014-07-04 NOTE — ED Notes (Signed)
PA Marissa at the bedside.  

## 2014-07-04 NOTE — ED Notes (Signed)
Called CT regarding time for CT.  Stated there were 2 ahead of her and then they would be over to get her.

## 2014-07-04 NOTE — ED Notes (Addendum)
Valuable purse x2

## 2014-07-04 NOTE — ED Provider Notes (Signed)
CSN: [id number]     Arrival date & time 07/04/14  1616 History   None    Chief Complaint  Patient presents with  . Fall     (Consider location/radiation/quality/duration/timing/severity/associated sxs/prior Treatment) The history is provided by the patient and a relative. No language interpreter was used.  Katherine Mcmahon is an 80 year old female with past medical history of cataract surgery presenting to the ED with a fall that occurred on Tuesday. Daughter who accompanies patient reported that [place]. Stated that she tripped over a stool and landed on her right side. Patient reports she's been experiencing right-sided chest discomfort described as a "very painful" pain that is constant. Stated that she's been using ibuprofen with minimal relief. Daughter reports that patient lives at home alone. Patient denied shortness of breath, difficulty breathing, chest pain, numbness, tingling, head injury, loss of consciousness, blurred vision, sudden loss of vision, neck pain, neck stiffness. PCP Dr. Venetia Maxon came  Past Medical History  Diagnosis Date  . Cataract    History reviewed. No pertinent past surgical history. Family History  Problem Relation Age of Onset  . Cancer Mother     colon  . Heart attack Father   . Cancer Sister     bone and breast   History  Substance Use Topics  . Smoking status: Current Every Day Smoker  . Smokeless tobacco: Not on file  . Alcohol Use: No   OB History   Grav Para Term Preterm Abortions TAB SAB Ect Mult Living                 Review of Systems  Constitutional: Negative for fever and chills.  Respiratory: Negative for chest tightness and shortness of breath.   Cardiovascular: Positive for chest pain (Musculoskeletal).  Gastrointestinal: Positive for abdominal pain. Negative for nausea, vomiting, diarrhea, constipation, blood in stool and anal bleeding.  Musculoskeletal: Negative for back pain and neck pain.  Neurological:  Negative for dizziness, weakness and headaches.      Allergies  Review of patient's allergies indicates no known allergies.  Home Medications   Prior to Admission medications   Medication Sig Start Date End Date Taking? Authorizing Provider  aspirin 81 MG tablet Take 81 mg by mouth daily.   Yes Historical Provider, MD  beta carotene w/minerals (OCUVITE) tablet Take 1 tablet by mouth daily.   Yes Historical Provider, MD   BP 143/95  Pulse 96  Temp(Src) 99 F (37.2 C) (Oral)  Resp 15  SpO2 97% Physical Exam  Nursing note and vitals reviewed. Constitutional: She is oriented to person, place, and time. She appears well-developed and well-nourished. No distress.  HENT:  Head: Normocephalic and atraumatic.  Right Ear: External ear normal.  Left Ear: External ear normal.  Nose: Nose normal.  Mouth/Throat: Oropharynx is clear and moist. No oropharyngeal exudate.  Negative facial trauma Negative palpation hematomas  Negative crepitus or depression palpated to the skull/maxillary region Negative damage noted to dentition Negative septal hematoma noted  Eyes: Conjunctivae and EOM are normal. Pupils are equal, round, and reactive to light. Right eye exhibits no discharge. Left eye exhibits no discharge.  Negative nystagmus Visual fields grossly intact Negative crepitus upon palpation to the orbital Negative signs of entrapment  Neck: Normal range of motion. Neck supple. No tracheal deviation present.  Negative neck stiffness Negative nuchal rigidity Negative cervical lymphadenopathy Negative pain upon palpation to the c-spine  Cardiovascular: Normal rate, regular rhythm and normal heart sounds.  Exam reveals no friction rub.   No murmur heard. Pulses:      Radial pulses are 2+ on the right side, and 2+ on the left side.       Dorsalis pedis pulses are 2+ on the right side, and 2+ on the left side.  Cap refill less than 3 seconds  Pulmonary/Chest: Effort normal and breath sounds  normal. No respiratory distress. She has no wheezes. She has no rales. She exhibits tenderness.    Patient is able to speak in full sentences without difficulty Negative use of accessory muscles Negative stridor  Ecchymosis identified to the right side of the ribs localized to lower portion and posterior aspects. Appears to be affecting the seventh rib. Discomfort upon palpation. Negative crepitus upon palpation.  Abdominal: Soft. Bowel sounds are normal. She exhibits no distension. There is tenderness in the right upper quadrant. There is no rebound and no guarding.  Negative abdominal distention Bowel sounds normoactive in all 4 quadrants Abdomen soft Discomfort upon palpation to the right upper quadrant Negative rigidity or guarding Negative peritoneal signs  Musculoskeletal: Normal range of motion. She exhibits no tenderness.  Full ROM to upper and lower extremities without difficulty noted, negative ataxia noted.  Lymphadenopathy:    She has no cervical adenopathy.  Neurological: She is alert and oriented to person, place, and time. No cranial nerve deficit. She exhibits normal muscle tone. Coordination normal.  Cranial nerves III-XII grossly intact Strength 5+/5+ to upper and lower extremities bilaterally with resistance applied, equal distribution noted Sensation intact with differentiation sharp and dull touch Equal grip strength Negative facial drooping Negative slurred speech Negative aphasia Negative arm drift Fine motor skills intact Heel to knee down shin normal bilaterally Gait proper, proper balance - negative sway, negative drift, negative step-offs  Skin: Skin is warm and dry. No rash noted. She is not diaphoretic. No erythema.  Psychiatric: She has a normal mood and affect. Her behavior is normal. Thought content normal.    ED Course  Procedures (including critical care time)  11:27 PM Patient seen and assessed by Dr. Charlann Boxer.   12:54 AM This provider was made  aware that when patient came back from imaging she was having nausea and an episode of vomiting. Zofran ODT was given. Repeat EKG and i-stat troponin ordered. Patient returned to room and placed on monitoring.   1:18 Am This provider spoke with Dr. Janee Morn, on call for Trauma - discussed case, labs, imaging in great detail. Patient to be admitted to Trauma Service.   Results for orders placed during the hospital encounter of 07/04/14  CBC      Result Value Ref Range   WBC 8.8  4.0 - 10.5 K/uL   RBC 4.73  3.87 - 5.11 MIL/uL   Hemoglobin 14.3  12.0 - 15.0 g/dL   HCT 16.1  09.6 - 04.5 %   MCV 90.7  78.0 - 100.0 fL   MCH 30.2  26.0 - 34.0 pg   MCHC 33.3  30.0 - 36.0 g/dL   RDW 40.9  81.1 - 91.4 %   Platelets 246  150 - 400 K/uL  COMPREHENSIVE METABOLIC PANEL      Result Value Ref Range   Sodium 140  137 - 147 mEq/L   Potassium 5.1  3.7 - 5.3 mEq/L   Chloride 105  96 - 112 mEq/L   CO2 19  19 - 32 mEq/L   Glucose, Bld 98  70 - 99 mg/dL   BUN  23  6 - 23 mg/dL   Creatinine, Ser 4.090.75  0.50 - 1.10 mg/dL   Calcium 9.0  8.4 - 81.110.5 mg/dL   Total Protein 7.7  6.0 - 8.3 g/dL   Albumin 3.8  3.5 - 5.2 g/dL   AST 35  0 - 37 U/L   ALT 15  0 - 35 U/L   Alkaline Phosphatase 63  39 - 117 U/L   Total Bilirubin 0.5  0.3 - 1.2 mg/dL   GFR calc non Af Amer 75 (*) >90 mL/min   GFR calc Af Amer 87 (*) >90 mL/min   Anion gap 16 (*) 5 - 15  TROPONIN I      Result Value Ref Range   Troponin I <0.30  <0.30 ng/mL  LIPASE, BLOOD      Result Value Ref Range   Lipase 47  11 - 59 U/L    Labs Review Labs Reviewed  COMPREHENSIVE METABOLIC PANEL - Abnormal; Notable for the following:    GFR calc non Af Amer 75 (*)    GFR calc Af Amer 87 (*)    Anion gap 16 (*)    All other components within normal limits  CBC  TROPONIN I  LIPASE, BLOOD  I-STAT TROPOININ, ED    Imaging Review Dg Ribs Unilateral W/chest Right  07/04/2014   CLINICAL DATA:  Larey SeatFell Tuesday night, posterior rib bruising  EXAM: RIGHT RIBS  AND CHEST - 3+ VIEW  COMPARISON:  03/21/2013  FINDINGS: The heart size and vascular pattern are normal. No consolidation effusion or pneumothorax. There is a BB indicating pain over the posterior lateral right seventh rib and there is a nondisplaced hairline fracture of this rib.  IMPRESSION: Nondisplaced hairline fracture lateral aspect of right seventh rib.   Electronically Signed   By: Esperanza Heiraymond  Rubner M.D.   On: 07/04/2014 18:33   Dg Cervical Spine Complete  07/04/2014   CLINICAL DATA:  Fall 2 days ago.  Neck pain.  EXAM: CERVICAL SPINE  4+ VIEWS  COMPARISON:  None.  FINDINGS: No evidence of acute fracture. There is 1 to 2mm C6-7 anterolisthesis associated with facet osteoarthritis. No prevertebral swelling.  Advanced mid cervical degenerative disc disease with large posterior osteophytic ridges at C4-5 and C5-6. At the same levels, bulky uncovertebral spurs encroach on the bilateral foramina.  Cervical carotid atherosclerosis.  IMPRESSION: 1.  No evidence of cervical spine fracture. 2. Slight C6-7 anterolisthesis is nonspecific but associated with facet osteoarthritis, the most likely cause. 3. C4-5 and C5-6 spondylosis with spurs narrowing the canal and foramina.   Electronically Signed   By: Tiburcio PeaJonathan  Watts M.D.   On: 07/04/2014 22:48   Ct Head Wo Contrast  07/05/2014   CLINICAL DATA:  Fall.  EXAM: CT HEAD WITHOUT CONTRAST  TECHNIQUE: Contiguous axial images were obtained from the base of the skull through the vertex without intravenous contrast.  COMPARISON:  None currently available.  FINDINGS: Skull and Sinuses:Negative for acute fracture. Subtotal right mastoid effusion. No nasopharyngeal abnormality.  Orbits: Bilateral cataract resection.  Brain: No evidence of acute abnormality, such as acute infarction, hemorrhage, hydrocephalus, or mass lesion/mass effect. Generalized brain atrophy. There is chronic small vessel disease which is moderate in severity. Ischemic gliosis present throughout the  bilateral periventricular white matter.  IMPRESSION: 1. No acute intracranial injury. 2. Brain atrophy and chronic small vessel disease.   Electronically Signed   By: Tiburcio PeaJonathan  Watts M.D.   On: 07/05/2014 01:06   Ct Chest W Contrast  07/05/2014   CLINICAL DATA:  Fall from bed, right pain.  Difficulty breathing.  EXAM: CT CHEST, ABDOMEN AND PELVIS WITHOUT CONTRAST  TECHNIQUE: Multidetector CT imaging of the chest, abdomen and pelvis was performed following the standard protocol without IV contrast.  COMPARISON:  Chest radiograph July 04, 2014  FINDINGS: CT CHEST FINDINGS  Heart size is upper limits of normal, coronary artery calcifications. Pericardium is unremarkable. Thoracic aorta is normal in course and caliber with moderate callus cystic atherosclerosis. No lymphadenopathy by CT size criteria.  Moderate centrilobular emphysema. 3 mm right middle lobe ground-glass nodule below size surveillance recommendations. Enhancing right lung base atelectasis with pleural thickening. Tracheobronchial tree is patent and midline. No pneumothorax.  Right lateral minimally displaced 6, displaced seventh rib fractures with overlying soft tissue swelling and subcutaneous small hematoma. Thoracic vertebral bodies appear intact and aligned with maintenance of thoracic kyphosis.  CT ABDOMEN AND PELVIS FINDINGS  Multiple subcentimeter hypodensities in the liver likely reflect cysts, the liver is otherwise unremarkable. Multiple tiny layering gallstones without CT findings of acute cholecystitis. 8 mm left adrenal nodularity. Right adrenal gland is unremarkable. Coarse calcification in pancreatic head associated with focal atrophy suggest sequelae of chronic pancreatitis without acute component.  Small hiatal hernia. Stomach, small large bowel are normal in course and caliber without inflammatory changes. Normal appendix. Sigmoid diverticulosis. No intraperitoneal free fluid nor free air.  Kidneys are unremarkable. Delayed imaging  demonstrates prompt symmetric excretion of contrast into the proximal urinary collecting system. Chronic infrarenal aortic dissection in a background moderate to severe calcific atherosclerosis.  Soft tissues are unremarkable. Remote pubic symphysis, remote left superior and bleed inferior pubic ramus fracture. Mild lumbar spondylosis without fracture deformity or malalignment. Mild osteopenia without destructive bony lesions. Anterior abdominal wall laxity.  IMPRESSION: CT chest: Acute right lateral displaced sixth and seventh rib fractures. Trace right effusion/hemothorax and atelectasis without CT findings of contusion or pneumothorax (grade 1 chest wall trauma).  CT abdomen and pelvis:  No acute intra-abdominal or pelvic process.  Cholelithiasis.   Electronically Signed   By: Awilda Metro   On: 07/05/2014 00:51   Ct Abdomen Pelvis W Contrast  07/05/2014   CLINICAL DATA:  Fall from bed, right pain.  Difficulty breathing.  EXAM: CT CHEST, ABDOMEN AND PELVIS WITHOUT CONTRAST  TECHNIQUE: Multidetector CT imaging of the chest, abdomen and pelvis was performed following the standard protocol without IV contrast.  COMPARISON:  Chest radiograph July 04, 2014  FINDINGS: CT CHEST FINDINGS  Heart size is upper limits of normal, coronary artery calcifications. Pericardium is unremarkable. Thoracic aorta is normal in course and caliber with moderate callus cystic atherosclerosis. No lymphadenopathy by CT size criteria.  Moderate centrilobular emphysema. 3 mm right middle lobe ground-glass nodule below size surveillance recommendations. Enhancing right lung base atelectasis with pleural thickening. Tracheobronchial tree is patent and midline. No pneumothorax.  Right lateral minimally displaced 6, displaced seventh rib fractures with overlying soft tissue swelling and subcutaneous small hematoma. Thoracic vertebral bodies appear intact and aligned with maintenance of thoracic kyphosis.  CT ABDOMEN AND PELVIS FINDINGS   Multiple subcentimeter hypodensities in the liver likely reflect cysts, the liver is otherwise unremarkable. Multiple tiny layering gallstones without CT findings of acute cholecystitis. 8 mm left adrenal nodularity. Right adrenal gland is unremarkable. Coarse calcification in pancreatic head associated with focal atrophy suggest sequelae of chronic pancreatitis without acute component.  Small hiatal hernia. Stomach, small large bowel are normal in course and caliber without inflammatory changes. Normal appendix. Sigmoid  diverticulosis. No intraperitoneal free fluid nor free air.  Kidneys are unremarkable. Delayed imaging demonstrates prompt symmetric excretion of contrast into the proximal urinary collecting system. Chronic infrarenal aortic dissection in a background moderate to severe calcific atherosclerosis.  Soft tissues are unremarkable. Remote pubic symphysis, remote left superior and bleed inferior pubic ramus fracture. Mild lumbar spondylosis without fracture deformity or malalignment. Mild osteopenia without destructive bony lesions. Anterior abdominal wall laxity.  IMPRESSION: CT chest: Acute right lateral displaced sixth and seventh rib fractures. Trace right effusion/hemothorax and atelectasis without CT findings of contusion or pneumothorax (grade 1 chest wall trauma).  CT abdomen and pelvis:  No acute intra-abdominal or pelvic process.  Cholelithiasis.   Electronically Signed   By: Awilda Metro   On: 07/05/2014 00:51     EKG Interpretation   Date/Time:  Thursday July 04 2014 21:22:38 EDT Ventricular Rate:  86 PR Interval:  168 QRS Duration: 158 QT Interval:  422 QTC Calculation: 505 R Axis:   -52 Text Interpretation:  Normal sinus rhythm Left bundle branch block No old  tracing to compare Confirmed by RAY MD, Duwayne Heck [phone]) on 07/04/2014  9:31:01 PM      MDM   Final diagnoses:  Right rib fracture, closed, initial encounter  Fall, initial encounter  Hemothorax on right      Medications  sodium chloride 0.9 % bolus 500 mL (0 mLs Intravenous Stopped 07/04/14 2250)  fentaNYL (SUBLIMAZE) injection 25 mcg (25 mcg Intravenous Given 07/04/14 2200)  iohexol (OMNIPAQUE) 300 MG/ML solution 80 mL (80 mLs Intravenous Contrast Given 07/05/14 0035)  ondansetron (ZOFRAN-ODT) disintegrating tablet 2 mg (2 mg Oral Given 07/05/14 0052)  sodium chloride 0.9 % bolus 500 mL (500 mLs Intravenous New Bag/Given 07/05/14 0117)   Filed Vitals:   07/04/14 2130 07/04/14 2300 07/04/14 2330 07/05/14 0100  BP: 126/81 145/76 156/76 143/95  Pulse: 88 80 81 96  Temp:      TempSrc:      Resp: 14 18 14 15   SpO2: 96% 92% 94% 97%    EKG noted normal sinus rhythm. Labs unremarkable. CT chest noted 2 laterally displaced fractures of the 6th and 7th ribs with hemothorax noted. Patient has been given pain medications in the ED setting with poor control. Patient reported that she has been having difficulty breathing. Patient lives alone. Trauma consulted - patient to be admitted to Trauma Services, will be need PT/OT. Discussed plan for admission with patient and daughter who agree and understand. Patient stable for transfer.   Raymon Mutton, PA-C 07/05/14 1920

## 2014-07-04 NOTE — ED Notes (Signed)
Patient transported to X-ray 

## 2014-07-04 NOTE — ED Notes (Signed)
Pt states that she fell on Tuesday when trying to get out of bed. Pt landed on her rt side and has bruising noted to rt breast and rib cage. Family states that pain is worse with breathing and coughing.

## 2014-07-05 ENCOUNTER — Encounter (HOSPITAL_COMMUNITY): Payer: Self-pay | Admitting: Radiology

## 2014-07-05 ENCOUNTER — Emergency Department (HOSPITAL_COMMUNITY): Payer: Medicare Other

## 2014-07-05 DIAGNOSIS — W010XXA Fall on same level from slipping, tripping and stumbling without subsequent striking against object, initial encounter: Secondary | ICD-10-CM | POA: Diagnosis present

## 2014-07-05 DIAGNOSIS — H269 Unspecified cataract: Secondary | ICD-10-CM | POA: Diagnosis present

## 2014-07-05 DIAGNOSIS — Y92009 Unspecified place in unspecified non-institutional (private) residence as the place of occurrence of the external cause: Secondary | ICD-10-CM | POA: Diagnosis not present

## 2014-07-05 DIAGNOSIS — W19XXXA Unspecified fall, initial encounter: Secondary | ICD-10-CM

## 2014-07-05 DIAGNOSIS — S2231XA Fracture of one rib, right side, initial encounter for closed fracture: Secondary | ICD-10-CM

## 2014-07-05 DIAGNOSIS — S27329A Contusion of lung, unspecified, initial encounter: Secondary | ICD-10-CM | POA: Diagnosis present

## 2014-07-05 DIAGNOSIS — F17200 Nicotine dependence, unspecified, uncomplicated: Secondary | ICD-10-CM | POA: Diagnosis present

## 2014-07-05 DIAGNOSIS — Z7982 Long term (current) use of aspirin: Secondary | ICD-10-CM | POA: Diagnosis not present

## 2014-07-05 DIAGNOSIS — Z79899 Other long term (current) drug therapy: Secondary | ICD-10-CM | POA: Diagnosis not present

## 2014-07-05 DIAGNOSIS — S2249XA Multiple fractures of ribs, unspecified side, initial encounter for closed fracture: Secondary | ICD-10-CM

## 2014-07-05 DIAGNOSIS — S20219A Contusion of unspecified front wall of thorax, initial encounter: Secondary | ICD-10-CM

## 2014-07-05 LAB — I-STAT TROPONIN, ED: Troponin i, poc: 0.01 ng/mL (ref 0.00–0.08)

## 2014-07-05 MED ORDER — TRAMADOL HCL 50 MG PO TABS
50.0000 mg | ORAL_TABLET | Freq: Four times a day (QID) | ORAL | Status: DC | PRN
  Administered 2014-07-05: 100 mg via ORAL
  Administered 2014-07-05 – 2014-07-06 (×2): 50 mg via ORAL
  Administered 2014-07-06: 100 mg via ORAL
  Filled 2014-07-05: qty 2
  Filled 2014-07-05: qty 1
  Filled 2014-07-05: qty 2
  Filled 2014-07-05: qty 1

## 2014-07-05 MED ORDER — SODIUM CHLORIDE 0.9 % IJ SOLN
3.0000 mL | Freq: Two times a day (BID) | INTRAMUSCULAR | Status: DC
  Administered 2014-07-05: 3 mL via INTRAVENOUS

## 2014-07-05 MED ORDER — MORPHINE SULFATE 2 MG/ML IJ SOLN: 2.0000 mg | INTRAMUSCULAR | Status: DC | PRN

## 2014-07-05 MED ORDER — ONDANSETRON HCL 4 MG PO TABS: 4.0000 mg | ORAL_TABLET | Freq: Four times a day (QID) | ORAL | Status: DC | PRN

## 2014-07-05 MED ORDER — IOHEXOL 300 MG/ML  SOLN
80.0000 mL | Freq: Once | INTRAMUSCULAR | Status: AC | PRN
  Administered 2014-07-05: 80 mL via INTRAVENOUS

## 2014-07-05 MED ORDER — ONDANSETRON HCL 4 MG/2ML IJ SOLN: 4.0000 mg | Freq: Four times a day (QID) | INTRAMUSCULAR | Status: DC | PRN

## 2014-07-05 MED ORDER — POLYETHYLENE GLYCOL 3350 17 G PO PACK
17.0000 g | PACK | Freq: Every day | ORAL | Status: DC
  Administered 2014-07-05 – 2014-07-06 (×2): 17 g via ORAL
  Filled 2014-07-05 (×3): qty 1

## 2014-07-05 MED ORDER — SODIUM CHLORIDE 0.9 % IJ SOLN: 3.0000 mL | INTRAMUSCULAR | Status: DC | PRN

## 2014-07-05 MED ORDER — ASPIRIN EC 81 MG PO TBEC
81.0000 mg | DELAYED_RELEASE_TABLET | Freq: Every day | ORAL | Status: DC
  Administered 2014-07-05 – 2014-07-06 (×2): 81 mg via ORAL
  Filled 2014-07-05 (×2): qty 1

## 2014-07-05 MED ORDER — PANTOPRAZOLE SODIUM 40 MG IV SOLR
40.0000 mg | Freq: Every day | INTRAVENOUS | Status: DC
  Filled 2014-07-05 (×2): qty 40

## 2014-07-05 MED ORDER — SODIUM CHLORIDE 0.9 % IV SOLN: 250.0000 mL | INTRAVENOUS | Status: DC | PRN

## 2014-07-05 MED ORDER — ONDANSETRON 4 MG PO TBDP
2.0000 mg | ORAL_TABLET | Freq: Once | ORAL | Status: AC
  Administered 2014-07-05: 2 mg via ORAL
  Filled 2014-07-05: qty 1

## 2014-07-05 MED ORDER — PANTOPRAZOLE SODIUM 40 MG PO TBEC
40.0000 mg | DELAYED_RELEASE_TABLET | Freq: Every day | ORAL | Status: DC
  Administered 2014-07-05 – 2014-07-06 (×2): 40 mg via ORAL
  Filled 2014-07-05 (×2): qty 1

## 2014-07-05 MED ORDER — ACETAMINOPHEN 325 MG PO TABS: 650.0000 mg | ORAL_TABLET | ORAL | Status: DC | PRN

## 2014-07-05 MED ORDER — SODIUM CHLORIDE 0.9 % IV BOLUS (SEPSIS)
500.0000 mL | Freq: Once | INTRAVENOUS | Status: AC
  Administered 2014-07-05: 500 mL via INTRAVENOUS

## 2014-07-05 MED ORDER — TRAMADOL HCL 50 MG PO TABS
50.0000 mg | ORAL_TABLET | Freq: Four times a day (QID) | ORAL | Status: DC | PRN
  Administered 2014-07-05: 50 mg via ORAL
  Filled 2014-07-05: qty 1

## 2014-07-05 MED ORDER — ENOXAPARIN SODIUM 40 MG/0.4ML ~~LOC~~ SOLN
40.0000 mg | Freq: Every day | SUBCUTANEOUS | Status: DC
  Administered 2014-07-05 – 2014-07-06 (×2): 40 mg via SUBCUTANEOUS
  Filled 2014-07-05 (×2): qty 0.4

## 2014-07-05 MED ORDER — DOCUSATE SODIUM 100 MG PO CAPS
100.0000 mg | ORAL_CAPSULE | Freq: Two times a day (BID) | ORAL | Status: DC
  Administered 2014-07-05 – 2014-07-06 (×3): 100 mg via ORAL
  Filled 2014-07-05 (×4): qty 1

## 2014-07-05 NOTE — ED Notes (Signed)
Trauma surgeon at the bedside. 

## 2014-07-05 NOTE — Evaluation (Signed)
Physical Therapy Evaluation and Discharge Patient Details Name: Katherine Mcmahon MRN: [id number] DOB: 09/16/1927 Today's Date: 07/05/2014   History of Present Illness  80 y.o. female s/p fall resulting in right sided rib fx #6 and #7.  Clinical Impression  Patient evaluated by Physical Therapy with no further acute PT needs identified. All education has been completed and the patient has no further questions. Pt ambulating generally well, mild instability without an assistive device, greatly improved with use of a rolling walker, declines using a cane. Patient will have PRN care from family and this will be adequate given her current mobility status. They are checking to see if they have a tub bench for temporary use. Pt should follow up for balance deficits at outpatient therapy clinic.  PT is signing off. Thank you for this referral.     Follow Up Recommendations Outpatient PT;Supervision - Intermittent    Equipment Recommendations  None recommended by PT    Recommendations for Other Services OT consult     Precautions / Restrictions Precautions Precautions: Fall Restrictions Weight Bearing Restrictions: No      Mobility  Bed Mobility Overal bed mobility: Modified Independent             General bed mobility comments: Does not require physical assist but needs extra time.  Transfers Overall transfer level: Needs assistance Equipment used: Rolling walker (2 wheeled);None Transfers: Sit to/from Stand Sit to Stand: Supervision         General transfer comment: Supervision for safety with no loss of balance with and without a walker. Performed from lowest bed setting, recliner x2 and low table mat. Safely places hands on stable surface to stand.  Ambulation/Gait Ambulation/Gait assistance: Supervision Ambulation Distance (Feet): 150 Feet (x2) Assistive device: Rolling walker (2 wheeled);None Gait Pattern/deviations: Step-through pattern;Decreased stride length;Drifts  right/left   Gait velocity interpretation: Below normal speed for age/gender General Gait Details: Mild drifting Lt/Rt without assistive device, more pronounced with head turns horizontally and vertically with significant decrease in gait speed. Much improved stability with use of rolling walker. Offered cane as option but pt prefers RW. Educated on safe DME use and was able to ambulate safely with RW. No further questions from Pt or daughter.  Stairs            Wheelchair Mobility    Modified Rankin (Stroke Patients Only)       Balance Overall balance assessment: History of Falls;Needs assistance Sitting-balance support: Feet supported;No upper extremity supported Sitting balance-Leahy Scale: Good     Standing balance support: No upper extremity supported Standing balance-Leahy Scale: Good                               Pertinent Vitals/Pain Pain Assessment: 0-10 Pain Score: 8  Pain Location: right ribs Pain Descriptors / Indicators: Aching;Dull Pain Intervention(s): Limited activity within patient's tolerance;Monitored during session;Premedicated before session;Repositioned    Home Living Family/patient expects to be discharged to:: Private residence Living Arrangements: Alone Available Help at Discharge: Family;Available PRN/intermittently Type of Home: House Home Access: Stairs to enter Entrance Stairs-Rails: Psychiatric nurse of Steps: 1 Home Layout: Laundry or work area in basement;Able to live on main level with bedroom/bathroom Home Equipment: Kasandra Knudsen - quad;Walker - 2 wheels;Grab bars - tub/shower      Prior Function Level of Independence: Independent               Hand Dominance   Dominant  Hand: Right    Extremity/Trunk Assessment   Upper Extremity Assessment: Defer to OT evaluation           Lower Extremity Assessment: Overall WFL for tasks assessed         Communication   Communication: No difficulties   Cognition Arousal/Alertness: Awake/alert Behavior During Therapy: WFL for tasks assessed/performed Overall Cognitive Status: Within Functional Limits for tasks assessed                      General Comments General comments (skin integrity, edema, etc.): Reviewed use of incentive spirometer to increase pulmonary function and prevent secondary complications. Discussed d/c planning and home safety with pt and daughter to was also present during therapy session.    Exercises        Assessment/Plan    PT Assessment All further PT needs can be met in the next venue of care  PT Diagnosis Abnormality of gait;Acute pain   PT Problem List Decreased activity tolerance;Decreased balance;Decreased mobility;Pain  PT Treatment Interventions     PT Goals (Current goals can be found in the Care Plan section) Acute Rehab PT Goals Patient Stated Goal: Go home PT Goal Formulation: No goals set, d/c therapy    Frequency     Barriers to discharge        Co-evaluation               End of Session   Activity Tolerance: Patient tolerated treatment well Patient left: in chair;with call bell/phone within reach;with chair alarm set;with family/visitor present Nurse Communication: Mobility status         Time: 5038-8828 PT Time Calculation (min): 38 min   Charges:   PT Evaluation $Initial PT Evaluation Tier I: 1 Procedure PT Treatments $Gait Training: 8-22 mins $Therapeutic Activity: 8-22 mins   PT G Codes:         Katherine Mcmahon, [place]  Katherine Mcmahon 07/05/2014, 11:49 AM

## 2014-07-05 NOTE — ED Notes (Signed)
[facility]  Will return to P[facility] C when scans are completed.

## 2014-07-05 NOTE — H&P (Signed)
Katherine Mcmahon is an 80 y.o. female.   Chief Complaint: Right-sided rib pain HPI: Patient got up out of bed and lost her footing. She fell, striking her right lateral chest on a dresser. She complains of localized pain there. No loss of consciousness.She was evaluated in the emergency department. She was found to have right rib fractures #6 and #7. She had ongoing nausea and I was asked to admit her to the trauma service.  Past Medical History  Diagnosis Date  . Cataract     History reviewed. No pertinent past surgical history.  Family History  Problem Relation Age of Onset  . Cancer Mother     colon  . Heart attack Father   . Cancer Sister     bone and breast   Social History:  reports that she has been smoking.  She does not have any smokeless tobacco history on file. She reports that she does not drink alcohol. Her drug history is not on file.  Allergies: No Known Allergies   (Not in a hospital admission)  Results for orders placed during the hospital encounter of 07/04/14 (from the past 48 hour(s))  CBC     Status: None   Collection Time    07/04/14 10:06 PM      Result Value Ref Range   WBC 8.8  4.0 - 10.5 K/uL   RBC 4.73  3.87 - 5.11 MIL/uL   Hemoglobin 14.3  12.0 - 15.0 g/dL   HCT 42.9  36.0 - 46.0 %   MCV 90.7  78.0 - 100.0 fL   MCH 30.2  26.0 - 34.0 pg   MCHC 33.3  30.0 - 36.0 g/dL   RDW 14.1  11.5 - 15.5 %   Platelets 246  150 - 400 K/uL  COMPREHENSIVE METABOLIC PANEL     Status: Abnormal   Collection Time    07/04/14 10:06 PM      Result Value Ref Range   Sodium 140  137 - 147 mEq/L   Potassium 5.1  3.7 - 5.3 mEq/L   Comment: HEMOLYSIS AT THIS LEVEL MAY AFFECT RESULT   Chloride 105  96 - 112 mEq/L   CO2 19  19 - 32 mEq/L   Glucose, Bld 98  70 - 99 mg/dL   BUN 23  6 - 23 mg/dL   Creatinine, Ser 0.75  0.50 - 1.10 mg/dL   Calcium 9.0  8.4 - 10.5 mg/dL   Total Protein 7.7  6.0 - 8.3 g/dL   Albumin 3.8  3.5 - 5.2 g/dL   AST 35  0 - 37 U/L   ALT 15  0 -  35 U/L   Alkaline Phosphatase 63  39 - 117 U/L   Total Bilirubin 0.5  0.3 - 1.2 mg/dL   GFR calc non Af Amer 75 (*) >90 mL/min   GFR calc Af Amer 87 (*) >90 mL/min   Comment: (NOTE)     The eGFR has been calculated using the CKD EPI equation.     This calculation has not been validated in all clinical situations.     eGFR's persistently <90 mL/min signify possible Chronic Kidney     Disease.   Anion gap 16 (*) 5 - 15  LIPASE, BLOOD     Status: None   Collection Time    07/04/14 10:06 PM      Result Value Ref Range   Lipase 47  11 - 59 U/L  TROPONIN I  Status: None   Collection Time    07/04/14 10:07 PM      Result Value Ref Range   Troponin I <0.30  <0.30 ng/mL   Comment:            Due to the release kinetics of cTnI,     a negative result within the first hours     of the onset of symptoms does not rule out     myocardial infarction with certainty.     If myocardial infarction is still suspected,     repeat the test at appropriate intervals.   Dg Ribs Unilateral W/chest Right  07/04/2014   CLINICAL DATA:  [place] Tuesday night, posterior rib bruising  EXAM: RIGHT RIBS AND CHEST - 3+ VIEW  COMPARISON:  03/21/2013  FINDINGS: The heart size and vascular pattern are normal. No consolidation effusion or pneumothorax. There is a BB indicating pain over the posterior lateral right seventh rib and there is a nondisplaced hairline fracture of this rib.  IMPRESSION: Nondisplaced hairline fracture lateral aspect of right seventh rib.   Electronically Signed   By: Skipper Cliche M.D.   On: 07/04/2014 18:33   Dg Cervical Spine Complete  07/04/2014   CLINICAL DATA:  Fall 2 days ago.  Neck pain.  EXAM: CERVICAL SPINE  4+ VIEWS  COMPARISON:  None.  FINDINGS: No evidence of acute fracture. There is 1 to 49m C6-7 anterolisthesis associated with facet osteoarthritis. No prevertebral swelling.  Advanced mid cervical degenerative disc disease with large posterior osteophytic ridges at C4-5 and C5-6. At  the same levels, bulky uncovertebral spurs encroach on the bilateral foramina.  Cervical carotid atherosclerosis.  IMPRESSION: 1.  No evidence of cervical spine fracture. 2. Slight C6-7 anterolisthesis is nonspecific but associated with facet osteoarthritis, the most likely cause. 3. C4-5 and C5-6 spondylosis with spurs narrowing the canal and foramina.   Electronically Signed   By: JJorje GuildM.D.   On: 07/04/2014 22:48   Ct Head Wo Contrast  07/05/2014   CLINICAL DATA:  Fall.  EXAM: CT HEAD WITHOUT CONTRAST  TECHNIQUE: Contiguous axial images were obtained from the base of the skull through the vertex without intravenous contrast.  COMPARISON:  None currently available.  FINDINGS: Skull and Sinuses:Negative for acute fracture. Subtotal right mastoid effusion. No nasopharyngeal abnormality.  Orbits: Bilateral cataract resection.  Brain: No evidence of acute abnormality, such as acute infarction, hemorrhage, hydrocephalus, or mass lesion/mass effect. Generalized brain atrophy. There is chronic small vessel disease which is moderate in severity. Ischemic gliosis present throughout the bilateral periventricular white matter.  IMPRESSION: 1. No acute intracranial injury. 2. Brain atrophy and chronic small vessel disease.   Electronically Signed   By: JJorje GuildM.D.   On: 07/05/2014 01:06   Ct Chest W Contrast  07/05/2014   CLINICAL DATA:  Fall from bed, right pain.  Difficulty breathing.  EXAM: CT CHEST, ABDOMEN AND PELVIS WITHOUT CONTRAST  TECHNIQUE: Multidetector CT imaging of the chest, abdomen and pelvis was performed following the standard protocol without IV contrast.  COMPARISON:  Chest radiograph July 04, 2014  FINDINGS: CT CHEST FINDINGS  Heart size is upper limits of normal, coronary artery calcifications. Pericardium is unremarkable. Thoracic aorta is normal in course and caliber with moderate callus cystic atherosclerosis. No lymphadenopathy by CT size criteria.  Moderate centrilobular  emphysema. 3 mm right middle lobe ground-glass nodule below size surveillance recommendations. Enhancing right lung base atelectasis with pleural thickening. Tracheobronchial tree is patent and midline.  No pneumothorax.  Right lateral minimally displaced 6, displaced seventh rib fractures with overlying soft tissue swelling and subcutaneous small hematoma. Thoracic vertebral bodies appear intact and aligned with maintenance of thoracic kyphosis.  CT ABDOMEN AND PELVIS FINDINGS  Multiple subcentimeter hypodensities in the liver likely reflect cysts, the liver is otherwise unremarkable. Multiple tiny layering gallstones without CT findings of acute cholecystitis. 8 mm left adrenal nodularity. Right adrenal gland is unremarkable. Coarse calcification in pancreatic head associated with focal atrophy suggest sequelae of chronic pancreatitis without acute component.  Small hiatal hernia. Stomach, small large bowel are normal in course and caliber without inflammatory changes. Normal appendix. Sigmoid diverticulosis. No intraperitoneal free fluid nor free air.  Kidneys are unremarkable. Delayed imaging demonstrates prompt symmetric excretion of contrast into the proximal urinary collecting system. Chronic infrarenal aortic dissection in a background moderate to severe calcific atherosclerosis.  Soft tissues are unremarkable. Remote pubic symphysis, remote left superior and bleed inferior pubic ramus fracture. Mild lumbar spondylosis without fracture deformity or malalignment. Mild osteopenia without destructive bony lesions. Anterior abdominal wall laxity.  IMPRESSION: CT chest: Acute right lateral displaced sixth and seventh rib fractures. Trace right effusion/hemothorax and atelectasis without CT findings of contusion or pneumothorax (grade 1 chest wall trauma).  CT abdomen and pelvis:  No acute intra-abdominal or pelvic process.  Cholelithiasis.   Electronically Signed   By: Elon Alas   On: 07/05/2014 00:51    Ct Abdomen Pelvis W Contrast  07/05/2014   CLINICAL DATA:  Fall from bed, right pain.  Difficulty breathing.  EXAM: CT CHEST, ABDOMEN AND PELVIS WITHOUT CONTRAST  TECHNIQUE: Multidetector CT imaging of the chest, abdomen and pelvis was performed following the standard protocol without IV contrast.  COMPARISON:  Chest radiograph July 04, 2014  FINDINGS: CT CHEST FINDINGS  Heart size is upper limits of normal, coronary artery calcifications. Pericardium is unremarkable. Thoracic aorta is normal in course and caliber with moderate callus cystic atherosclerosis. No lymphadenopathy by CT size criteria.  Moderate centrilobular emphysema. 3 mm right middle lobe ground-glass nodule below size surveillance recommendations. Enhancing right lung base atelectasis with pleural thickening. Tracheobronchial tree is patent and midline. No pneumothorax.  Right lateral minimally displaced 6, displaced seventh rib fractures with overlying soft tissue swelling and subcutaneous small hematoma. Thoracic vertebral bodies appear intact and aligned with maintenance of thoracic kyphosis.  CT ABDOMEN AND PELVIS FINDINGS  Multiple subcentimeter hypodensities in the liver likely reflect cysts, the liver is otherwise unremarkable. Multiple tiny layering gallstones without CT findings of acute cholecystitis. 8 mm left adrenal nodularity. Right adrenal gland is unremarkable. Coarse calcification in pancreatic head associated with focal atrophy suggest sequelae of chronic pancreatitis without acute component.  Small hiatal hernia. Stomach, small large bowel are normal in course and caliber without inflammatory changes. Normal appendix. Sigmoid diverticulosis. No intraperitoneal free fluid nor free air.  Kidneys are unremarkable. Delayed imaging demonstrates prompt symmetric excretion of contrast into the proximal urinary collecting system. Chronic infrarenal aortic dissection in a background moderate to severe calcific atherosclerosis.  Soft  tissues are unremarkable. Remote pubic symphysis, remote left superior and bleed inferior pubic ramus fracture. Mild lumbar spondylosis without fracture deformity or malalignment. Mild osteopenia without destructive bony lesions. Anterior abdominal wall laxity.  IMPRESSION: CT chest: Acute right lateral displaced sixth and seventh rib fractures. Trace right effusion/hemothorax and atelectasis without CT findings of contusion or pneumothorax (grade 1 chest wall trauma).  CT abdomen and pelvis:  No acute intra-abdominal or pelvic process.  Cholelithiasis.  Electronically Signed   By: Elon Alas   On: 07/05/2014 00:51    Review of Systems  Constitutional: Negative.   HENT: Negative.   Eyes: Negative.   Respiratory:       Rib pain with deep breaths  Cardiovascular: Positive for chest pain.  Gastrointestinal: Negative.   Genitourinary: Negative.   Musculoskeletal: Negative.   Skin: Negative.   Neurological: Negative.   Endo/Heme/Allergies: Negative.   Psychiatric/Behavioral: Negative.     Blood pressure 143/95, pulse 96, temperature 99 F (37.2 C), temperature source Oral, resp. rate 15, SpO2 97.00%. Physical Exam  Constitutional: She is oriented to person, place, and time. She appears well-developed and well-nourished. No distress.  HENT:  Head: Normocephalic and atraumatic. Head is without abrasion and without contusion.  Right Ear: Hearing, tympanic membrane, external ear and ear canal normal.  Left Ear: Hearing, tympanic membrane, external ear and ear canal normal.  Nose: Nose normal.  Mouth/Throat: Uvula is midline, oropharynx is clear and moist and mucous membranes are normal.  Eyes: EOM are normal. Pupils are equal, round, and reactive to light. Left eye exhibits no discharge. No scleral icterus.  Neck: Normal range of motion. Neck supple. No tracheal deviation present.  No posterior midline tenderness  Cardiovascular: Normal rate, normal heart sounds and intact distal  pulses.   Respiratory: Effort normal. No stridor. No respiratory distress. She has no wheezes. She has no rales. She exhibits tenderness.  Large contusion right chest wall, no crepitance, localized rib tenderness  GI: Soft. She exhibits no distension. There is no tenderness. There is no rebound and no guarding.  Musculoskeletal: Normal range of motion.  Neurological: She is alert and oriented to person, place, and time. She has normal strength. She displays no tremor. She exhibits normal muscle tone. She displays no seizure activity. GCS eye subscore is 4. GCS verbal subscore is 5. GCS motor subscore is 6.  Skin: Skin is warm.  Psychiatric: She has a normal mood and affect.     Assessment/Plan Fall Right rib fracture 6, 7 Chest wall contusion  Admit to trauma for pain control, physical therapy. Patient lives alone. Plan was discussed with her daughter.  Elie Leppo E 07/05/2014, 1:49 AM

## 2014-07-05 NOTE — Progress Notes (Signed)
UR completed.  Aryaan Persichetti, RN BSN MHA CCM Trauma/Neuro ICU Case Manager [phone]  

## 2014-07-05 NOTE — Progress Notes (Signed)
Patient should be able to go home soon.  This patient has been seen and I agree with the findings and treatment plan.  Marta LamasJames O. Gae BonWyatt, III, MD, FACS [phone] (pager) [phone] (direct pager) Trauma Surgeon

## 2014-07-05 NOTE — Progress Notes (Signed)
Patient ID: Katherine ShoresJoyce W Coulon, female   DOB: 04/25/1927, 80 y.o.   MRN: [id number]   LOS: 1 day   Subjective: No unexpected c/o.   Objective: Vital signs in last 24 hours: Temp:  [97.3 F (36.3 C)-99 F (37.2 C)] 97.3 F (36.3 C) (08/07 0615) Pulse Rate:  [80-101] 82 (08/07 0615) Resp:  [14-18] 18 (08/07 0615) BP: (109-156)/(62-109) 120/62 mmHg (08/07 0615) SpO2:  [92 %-97 %] 94 % (08/07 0615) Weight:  [137 lb 11.2 oz (62.46 kg)] 137 lb 11.2 oz (62.46 kg) (08/07 0237) Last BM Date: 07/03/14   IS: 750ml   Physical Exam General appearance: alert and no distress Resp: clear to auscultation bilaterally Cardio: regular rate and rhythm GI: normal findings: bowel sounds normal and soft, non-tender   Assessment/Plan: Fall Multiple right rib fxs -- Pulmonary toilet FEN -- Bowel regimen VTE -- SCD's, Lovenox Dispo -- PT/OT    Freeman CaldronMichael J. Joselinne Lawal, PA-C Pager: [phone] General Trauma PA Pager: [phone]  07/05/2014

## 2014-07-06 MED ORDER — TRAMADOL HCL 50 MG PO TABS: 50.0000 mg | ORAL_TABLET | Freq: Four times a day (QID) | ORAL | Status: DC | PRN

## 2014-07-06 MED ORDER — DSS 100 MG PO CAPS: 100.0000 mg | ORAL_CAPSULE | Freq: Two times a day (BID) | ORAL | Status: DC

## 2014-07-06 MED ORDER — ACETAMINOPHEN 325 MG PO TABS: 650.0000 mg | ORAL_TABLET | ORAL | Status: DC | PRN

## 2014-07-06 NOTE — Discharge Instructions (Signed)

## 2014-07-06 NOTE — Discharge Summary (Signed)
Physician Discharge Summary  Marlowe ShoresJoyce W Holtzer ZOX:[id number] DOB: 1927-05-16 DOA: 07/04/2014  PCP: Ginette OttoSTONEKING,HAL THOMAS, MD  Consultation: none  Admit date: 07/04/2014 Discharge date: 07/06/2014  Recommendations for Outpatient Follow-up:   Follow-up Information   Follow up with [facility]. (As needed)    Contact information:   [address] [place] [address] [phone]      Discharge Diagnoses:  1. Fall 2. Right rib fractures, 6 and 7 3. Chest wall contusion   Surgical Procedure: none  Discharge Condition: stable Disposition: home  Diet recommendation: regular  Filed Weights   07/05/14 0237  Weight: 137 lb 11.2 oz (62.46 kg)     Filed Vitals:   07/06/14 0542  BP: 142/54  Pulse: 96  Temp: 98.5 F (36.9 C)  Resp: 20     Hospital Course:  Kathrine HaddockJoyce Mcintyre is a 80 year old female who fell at home after getting out of the bed.  She was found to have right sided rib fractures and a pulmonary contusion.  She was admitted for pain control.  She remained stable.  Therapies were initiated who recommended intermittent supervision.  i discussed this with the patient and her daughter.  I expressed my concerns for future falls given her age and the fact that she lives alone, however, both the patient and the daughter were adamant about going home and absolutely not placement.  She will have a daughter who stays with her at night.  On HD#2 she was tolerating a diet, ambulating, pain well controlled and therefore felt stable for discharge home with tramadol.  Medication risks, benefits and therapeutic alternatives were reviewed with the patient.  She verbalizes understanding. She can follow up in our clinic on PRN basis.     Physical Exam: General appearance: alert and oriented. Calm and cooperative No acute distress. VSS. Afebrile.  Resp: clear to auscultation bilaterally. Right sided chest wall tenderness and bruising.  Cardio: S1S1 RRR without murmurs  or gallops. No edema. GI: soft round and nontender. +BS x4 quadrants. No organomegaly, hernias or masses.  Pulses: +2 bilateral distal pulses without cyanosis  Neurologic: Mental status: Alert, oriented, thought content appropriate    Discharge Instructions     Medication List         acetaminophen 325 MG tablet  Commonly known as:  TYLENOL  Take 2 tablets (650 mg total) by mouth every 4 (four) hours as needed for mild pain.     aspirin 81 MG tablet  Take 81 mg by mouth daily.     beta carotene w/minerals tablet  Take 1 tablet by mouth daily.     DSS 100 MG Caps  Take 100 mg by mouth 2 (two) times daily.     traMADol 50 MG tablet  Commonly known as:  ULTRAM  Take 1 tablet (50 mg total) by mouth every 6 (six) hours as needed (50mg  for mild pain, 75mg  for moderate pain, 100mg  for severe pain).           Follow-up Information   Follow up with [facility]. (As needed)    Contact information:   [address] [place] [address] [phone]        The results of significant diagnostics from this hospitalization (including imaging, microbiology, ancillary and laboratory) are listed below for reference.    Significant Diagnostic Studies: Dg Ribs Unilateral W/chest Right  07/04/2014   CLINICAL DATA:  Larey SeatFell Tuesday night, posterior rib bruising  EXAM: RIGHT RIBS AND CHEST - 3+ VIEW  COMPARISON:  03/21/2013  FINDINGS: The heart size and vascular pattern are normal. No consolidation effusion or pneumothorax. There is a BB indicating pain over the posterior lateral right seventh rib and there is a nondisplaced hairline fracture of this rib.  IMPRESSION: Nondisplaced hairline fracture lateral aspect of right seventh rib.   Electronically Signed   By: Esperanza Heir M.D.   On: 07/04/2014 18:33   Dg Cervical Spine Complete  07/04/2014   CLINICAL DATA:  Fall 2 days ago.  Neck pain.  EXAM: CERVICAL SPINE  4+ VIEWS  COMPARISON:  None.  FINDINGS: No evidence of  acute fracture. There is 1 to 2mm C6-7 anterolisthesis associated with facet osteoarthritis. No prevertebral swelling.  Advanced mid cervical degenerative disc disease with large posterior osteophytic ridges at C4-5 and C5-6. At the same levels, bulky uncovertebral spurs encroach on the bilateral foramina.  Cervical carotid atherosclerosis.  IMPRESSION: 1.  No evidence of cervical spine fracture. 2. Slight C6-7 anterolisthesis is nonspecific but associated with facet osteoarthritis, the most likely cause. 3. C4-5 and C5-6 spondylosis with spurs narrowing the canal and foramina.   Electronically Signed   By: Tiburcio Pea M.D.   On: 07/04/2014 22:48   Ct Head Wo Contrast  07/05/2014   CLINICAL DATA:  Fall.  EXAM: CT HEAD WITHOUT CONTRAST  TECHNIQUE: Contiguous axial images were obtained from the base of the skull through the vertex without intravenous contrast.  COMPARISON:  None currently available.  FINDINGS: Skull and Sinuses:Negative for acute fracture. Subtotal right mastoid effusion. No nasopharyngeal abnormality.  Orbits: Bilateral cataract resection.  Brain: No evidence of acute abnormality, such as acute infarction, hemorrhage, hydrocephalus, or mass lesion/mass effect. Generalized brain atrophy. There is chronic small vessel disease which is moderate in severity. Ischemic gliosis present throughout the bilateral periventricular white matter.  IMPRESSION: 1. No acute intracranial injury. 2. Brain atrophy and chronic small vessel disease.   Electronically Signed   By: Tiburcio Pea M.D.   On: 07/05/2014 01:06   Ct Chest W Contrast  07/05/2014   CLINICAL DATA:  Fall from bed, right pain.  Difficulty breathing.  EXAM: CT CHEST, ABDOMEN AND PELVIS WITHOUT CONTRAST  TECHNIQUE: Multidetector CT imaging of the chest, abdomen and pelvis was performed following the standard protocol without IV contrast.  COMPARISON:  Chest radiograph July 04, 2014  FINDINGS: CT CHEST FINDINGS  Heart size is upper limits of  normal, coronary artery calcifications. Pericardium is unremarkable. Thoracic aorta is normal in course and caliber with moderate callus cystic atherosclerosis. No lymphadenopathy by CT size criteria.  Moderate centrilobular emphysema. 3 mm right middle lobe ground-glass nodule below size surveillance recommendations. Enhancing right lung base atelectasis with pleural thickening. Tracheobronchial tree is patent and midline. No pneumothorax.  Right lateral minimally displaced 6, displaced seventh rib fractures with overlying soft tissue swelling and subcutaneous small hematoma. Thoracic vertebral bodies appear intact and aligned with maintenance of thoracic kyphosis.  CT ABDOMEN AND PELVIS FINDINGS  Multiple subcentimeter hypodensities in the liver likely reflect cysts, the liver is otherwise unremarkable. Multiple tiny layering gallstones without CT findings of acute cholecystitis. 8 mm left adrenal nodularity. Right adrenal gland is unremarkable. Coarse calcification in pancreatic head associated with focal atrophy suggest sequelae of chronic pancreatitis without acute component.  Small hiatal hernia. Stomach, small large bowel are normal in course and caliber without inflammatory changes. Normal appendix. Sigmoid diverticulosis. No intraperitoneal free fluid nor free air.  Kidneys are unremarkable.  Delayed imaging demonstrates prompt symmetric excretion of contrast into the proximal urinary collecting system. Chronic infrarenal aortic dissection in a background moderate to severe calcific atherosclerosis.  Soft tissues are unremarkable. Remote pubic symphysis, remote left superior and bleed inferior pubic ramus fracture. Mild lumbar spondylosis without fracture deformity or malalignment. Mild osteopenia without destructive bony lesions. Anterior abdominal wall laxity.  IMPRESSION: CT chest: Acute right lateral displaced sixth and seventh rib fractures. Trace right effusion/hemothorax and atelectasis without CT  findings of contusion or pneumothorax (grade 1 chest wall trauma).  CT abdomen and pelvis:  No acute intra-abdominal or pelvic process.  Cholelithiasis.   Electronically Signed   By: Awilda Metro   On: 07/05/2014 00:51   Ct Abdomen Pelvis W Contrast  07/05/2014   CLINICAL DATA:  Fall from bed, right pain.  Difficulty breathing.  EXAM: CT CHEST, ABDOMEN AND PELVIS WITHOUT CONTRAST  TECHNIQUE: Multidetector CT imaging of the chest, abdomen and pelvis was performed following the standard protocol without IV contrast.  COMPARISON:  Chest radiograph July 04, 2014  FINDINGS: CT CHEST FINDINGS  Heart size is upper limits of normal, coronary artery calcifications. Pericardium is unremarkable. Thoracic aorta is normal in course and caliber with moderate callus cystic atherosclerosis. No lymphadenopathy by CT size criteria.  Moderate centrilobular emphysema. 3 mm right middle lobe ground-glass nodule below size surveillance recommendations. Enhancing right lung base atelectasis with pleural thickening. Tracheobronchial tree is patent and midline. No pneumothorax.  Right lateral minimally displaced 6, displaced seventh rib fractures with overlying soft tissue swelling and subcutaneous small hematoma. Thoracic vertebral bodies appear intact and aligned with maintenance of thoracic kyphosis.  CT ABDOMEN AND PELVIS FINDINGS  Multiple subcentimeter hypodensities in the liver likely reflect cysts, the liver is otherwise unremarkable. Multiple tiny layering gallstones without CT findings of acute cholecystitis. 8 mm left adrenal nodularity. Right adrenal gland is unremarkable. Coarse calcification in pancreatic head associated with focal atrophy suggest sequelae of chronic pancreatitis without acute component.  Small hiatal hernia. Stomach, small large bowel are normal in course and caliber without inflammatory changes. Normal appendix. Sigmoid diverticulosis. No intraperitoneal free fluid nor free air.  Kidneys are  unremarkable. Delayed imaging demonstrates prompt symmetric excretion of contrast into the proximal urinary collecting system. Chronic infrarenal aortic dissection in a background moderate to severe calcific atherosclerosis.  Soft tissues are unremarkable. Remote pubic symphysis, remote left superior and bleed inferior pubic ramus fracture. Mild lumbar spondylosis without fracture deformity or malalignment. Mild osteopenia without destructive bony lesions. Anterior abdominal wall laxity.  IMPRESSION: CT chest: Acute right lateral displaced sixth and seventh rib fractures. Trace right effusion/hemothorax and atelectasis without CT findings of contusion or pneumothorax (grade 1 chest wall trauma).  CT abdomen and pelvis:  No acute intra-abdominal or pelvic process.  Cholelithiasis.   Electronically Signed   By: Awilda Metro   On: 07/05/2014 00:51   Mm Digital Screening Bilateral  06/13/2014   CLINICAL DATA:  Screening.  EXAM: DIGITAL SCREENING BILATERAL MAMMOGRAM WITH CAD  COMPARISON:  Previous exam(s).  ACR Breast Density Category c: The breast tissue is heterogeneously dense, which may obscure small masses.  FINDINGS: There are no findings suspicious for malignancy. Images were processed with CAD.  IMPRESSION: No mammographic evidence of malignancy. A result letter of this screening mammogram will be mailed directly to the patient.  RECOMMENDATION: Screening mammogram in one year. (Code:SM-B-01Y)  BI-RADS CATEGORY  1: Negative.   Electronically Signed   By: Laveda Abbe M.D.   On: 06/13/2014 16:17  Microbiology: No results found for this or any previous visit (from the past 240 hour(s)).   Labs: Basic Metabolic Panel:  Recent Labs Lab 07/04/14 2206  NA 140  K 5.1  CL 105  CO2 19  GLUCOSE 98  BUN 23  CREATININE 0.75  CALCIUM 9.0   Liver Function Tests:  Recent Labs Lab 07/04/14 2206  AST 35  ALT 15  ALKPHOS 63  BILITOT 0.5  PROT 7.7  ALBUMIN 3.8    Recent Labs Lab  07/04/14 2206  LIPASE 47   No results found for this basename: AMMONIA,  in the last 168 hours CBC:  Recent Labs Lab 07/04/14 2206  WBC 8.8  HGB 14.3  HCT 42.9  MCV 90.7  PLT 246   Cardiac Enzymes:  Recent Labs Lab 07/04/14 2207  TROPONINI <0.30   BNP: BNP (last 3 results) No results found for this basename: PROBNP,  in the last 8760 hours CBG: No results found for this basename: GLUCAP,  in the last 168 hours  Active Problems:   Right rib fracture   Fall   Time coordinating discharge: <30 mins  Signed:  Taniaya Rudder, ANP-BC

## 2014-07-08 ENCOUNTER — Emergency Department (HOSPITAL_COMMUNITY): Payer: Medicare Other

## 2014-07-08 ENCOUNTER — Encounter (HOSPITAL_COMMUNITY): Payer: Self-pay | Admitting: Emergency Medicine

## 2014-07-08 ENCOUNTER — Emergency Department (HOSPITAL_COMMUNITY)
Admission: EM | Admit: 2014-07-08 | Discharge: 2014-07-08 | Disposition: A | Discharge status: Home / Self Care | Payer: Medicare Other | Attending: Emergency Medicine | Admitting: Emergency Medicine

## 2014-07-08 DIAGNOSIS — J90 Pleural effusion, not elsewhere classified: Secondary | ICD-10-CM

## 2014-07-08 DIAGNOSIS — Z7982 Long term (current) use of aspirin: Secondary | ICD-10-CM | POA: Diagnosis not present

## 2014-07-08 DIAGNOSIS — F29 Unspecified psychosis not due to a substance or known physiological condition: Secondary | ICD-10-CM | POA: Insufficient documentation

## 2014-07-08 DIAGNOSIS — H269 Unspecified cataract: Secondary | ICD-10-CM | POA: Diagnosis not present

## 2014-07-08 DIAGNOSIS — IMO0001 Reserved for inherently not codable concepts without codable children: Secondary | ICD-10-CM | POA: Diagnosis not present

## 2014-07-08 DIAGNOSIS — Z79899 Other long term (current) drug therapy: Secondary | ICD-10-CM | POA: Diagnosis not present

## 2014-07-08 DIAGNOSIS — R079 Chest pain, unspecified: Secondary | ICD-10-CM | POA: Diagnosis not present

## 2014-07-08 DIAGNOSIS — S2232XK Fracture of one rib, left side, subsequent encounter for fracture with nonunion: Secondary | ICD-10-CM

## 2014-07-08 DIAGNOSIS — R410 Disorientation, unspecified: Secondary | ICD-10-CM

## 2014-07-08 DIAGNOSIS — F17200 Nicotine dependence, unspecified, uncomplicated: Secondary | ICD-10-CM | POA: Insufficient documentation

## 2014-07-08 LAB — COMPREHENSIVE METABOLIC PANEL
ALBUMIN: 3.2 g/dL — AB (ref 3.5–5.2)
ALK PHOS: 69 U/L (ref 39–117)
ALT: 16 U/L (ref 0–35)
AST: 23 U/L (ref 0–37)
Anion gap: 14 (ref 5–15)
BILIRUBIN TOTAL: 0.6 mg/dL (ref 0.3–1.2)
BUN: 21 mg/dL (ref 6–23)
CO2: 23 mEq/L (ref 19–32)
Calcium: 8.6 mg/dL (ref 8.4–10.5)
Chloride: 99 mEq/L (ref 96–112)
Creatinine, Ser: 0.81 mg/dL (ref 0.50–1.10)
GFR calc Af Amer: 75 mL/min — ABNORMAL LOW (ref >90)
GFR calc non Af Amer: 64 mL/min — ABNORMAL LOW (ref >90)
GLUCOSE: 106 mg/dL — AB (ref 70–99)
POTASSIUM: 3.7 meq/L (ref 3.7–5.3)
SODIUM: 136 meq/L — AB (ref 137–147)
Total Protein: 6.6 g/dL (ref 6.0–8.3)

## 2014-07-08 LAB — URINALYSIS, ROUTINE W REFLEX MICROSCOPIC
BILIRUBIN URINE: NEGATIVE
Glucose, UA: NEGATIVE mg/dL
Ketones, ur: NEGATIVE mg/dL
Nitrite: POSITIVE — AB
PH: 5.5 (ref 5.0–8.0)
Protein, ur: NEGATIVE mg/dL
SPECIFIC GRAVITY, URINE: 1.017 (ref 1.005–1.030)
Urobilinogen, UA: 1 mg/dL (ref 0.0–1.0)

## 2014-07-08 LAB — CBC
HEMATOCRIT: 38.7 % (ref 36.0–46.0)
Hemoglobin: 12.9 g/dL (ref 12.0–15.0)
MCH: 30 pg (ref 26.0–34.0)
MCHC: 33.3 g/dL (ref 30.0–36.0)
MCV: 90 fL (ref 78.0–100.0)
Platelets: 236 10*3/uL (ref 150–400)
RBC: 4.3 MIL/uL (ref 3.87–5.11)
RDW: 14 % (ref 11.5–15.5)
WBC: 6.8 10*3/uL (ref 4.0–10.5)

## 2014-07-08 LAB — URINE MICROSCOPIC-ADD ON

## 2014-07-08 NOTE — ED Notes (Signed)
Patient transported to X-ray 

## 2014-07-08 NOTE — ED Provider Notes (Signed)
CSN: [id number]     Arrival date & time 07/08/14  1532 History   First MD Initiated Contact with Patient 07/08/14 1605     Chief Complaint  Patient presents with  . Fall     (Consider location/radiation/quality/duration/timing/severity/associated sxs/prior Treatment) HPI  Katherine Mcmahon is a 80 y.o. female who presents for evaluation of right chest pain, that is worse with movement, and confusion. She is taking tramadol, for pain. When she takes to tramadol. She gets very confused. When she takes a single tramadol her pain is not controlled. She is eating well. She has a cough, productive of yellow sputum. There has been no fever. She was hospitalized on 07/04/2014 4 rib fractures and small hemothorax. She was discharged with symptomatic treatment, after 2 days. The patient defers answers to questions to family members, even though she is able to answer questions. Her family members would like her placed for assistance. They feel that she is "not safe alone". She is taking her usual medications as prescribed, without relief. There are no other known modifying factors.   Past Medical History  Diagnosis Date  . Cataract    History reviewed. No pertinent past surgical history. Family History  Problem Relation Age of Onset  . Cancer Mother     colon  . Heart attack Father   . Cancer Sister     bone and breast   History  Substance Use Topics  . Smoking status: Current Every Day Smoker -- 0.50 packs/day for 64 years    Types: Cigarettes  . Smokeless tobacco: Not on file  . Alcohol Use: No   OB History   Grav Para Term Preterm Abortions TAB SAB Ect Mult Living                 Review of Systems  All other systems reviewed and are negative.     Allergies  Review of patient's allergies indicates no known allergies.  Home Medications   Prior to Admission medications   Medication Sig Start Date End Date Taking? Authorizing Provider  aspirin EC 81 MG tablet Take 81 mg by  mouth daily.   Yes Historical Provider, MD  beta carotene w/minerals (OCUVITE) tablet Take 1 tablet by mouth daily.   Yes Historical Provider, MD  traMADol (ULTRAM) 50 MG tablet Take 50-100 mg by mouth every 6 (six) hours as needed (pain).   Yes Historical Provider, MD   BP 128/82  Pulse 71  Temp(Src) 98 F (36.7 C) (Oral)  Resp 12  Ht 5\' 4"  (1.626 m)  Wt 137 lb (62.143 kg)  BMI 23.50 kg/m2  SpO2 93% Physical Exam  Nursing note and vitals reviewed. Constitutional: She is oriented to person, place, and time. She appears well-developed.  Elderly, frail  HENT:  Head: Normocephalic and atraumatic.  Eyes: Conjunctivae and EOM are normal. Pupils are equal, round, and reactive to light.  Neck: Normal range of motion and phonation normal. Neck supple.  Cardiovascular: Normal rate, regular rhythm and intact distal pulses.   Pulmonary/Chest: Effort normal and breath sounds normal. She exhibits no tenderness.  Mild diffuse, right anterior chest wall pain without crepitation or palpable deformity.  Abdominal: Soft. Bowel sounds are normal. She exhibits no distension. There is no tenderness. There is no guarding.  There is no tenderness to palpation of the right upper quadrant of the abdomen.  Musculoskeletal: Normal range of motion.  Neurological: She is alert and oriented to person, place, and time. She exhibits normal muscle tone.  She is lucid. There is no dysarthria, aphasia or nystagmus.  Skin: Skin is warm and dry.  Psychiatric: She has a normal mood and affect. Her behavior is normal. Judgment and thought content normal.    ED Course  Procedures (including critical care time)  Medications - No data to display  Patient Vitals for the past 24 hrs:  BP Temp Temp src Pulse Resp SpO2 Height Weight  07/08/14 1630 128/82 mmHg - - 71 12 93 % - -  07/08/14 1616 124/48 mmHg - - 69 16 94 % - -  07/08/14 1615 124/48 mmHg - - 67 12 92 % - -  07/08/14 1600 124/57 mmHg - - 69 13 95 % - -   07/08/14 1545 136/46 mmHg - - 69 13 95 % - -  07/08/14 1542 161/65 mmHg 98 F (36.7 C) Oral 76 9 98 % 5\' 4"  (1.626 m) 137 lb (62.143 kg)   17:45- I talked to Dr. Derrell Lolling, we reviewed her images together; he feels that she does not need further care for the rib fxs and pleural effusion.  She was seen by the ED Social Worker and Sports coach; she does not meet criteria for Rehab. Placement from the ED. Family informed that there is no medical reason to admit her to the Hospital.   5:27 PM Reevaluation with update and discussion. After initial assessment and treatment, an updated evaluation reveals she is calm, and comfortable, eating his sandwich and drinking fluids. She is alert and conversant. There are no other complaints. Findings discussed with patient and family members, all questions answered. Gioia Ranes L    Labs Review Labs Reviewed  CBC  COMPREHENSIVE METABOLIC PANEL  URINALYSIS, ROUTINE W REFLEX MICROSCOPIC    Imaging Review Dg Chest 2 View  07/08/2014   CLINICAL DATA:  80 year old female with fall and cough. History of recent right-sided rib fractures.  EXAM: CHEST  2 VIEW  COMPARISON:  07/05/2014 chest CT and 07/04/2014 radiographs  FINDINGS: Cardiomegaly is noted.  Increasing small right pleural effusion and right basilar atelectasis noted.  There is no evidence of pneumothorax.  A right lateral rib fracture identified on the frontal view now appear slightly displaced.  Remote left-sided rib fractures are present.  IMPRESSION: Increasing small right pleural effusion and right basilar atelectasis. One of the right lateral rib fractures now appear slightly displaced. No evidence of pneumothorax.   Electronically Signed   By: Laveda Abbe M.D.   On: 07/08/2014 17:16     EKG Interpretation   Date/Time:  Monday July 08 2014 16:14:53 EDT Ventricular Rate:  67 PR Interval:  327 QRS Duration: 183 QT Interval:  459 QTC Calculation: 485 R Axis:   -66 Text Interpretation:  Sinus  rhythm Left bundle branch block since last  tracing no significant change Confirmed by Dacia Capers  MD, Kalise Fickett ([id number]) on  07/08/2014 4:26:39 PM      MDM   Final diagnoses:  Left rib fracture, with nonunion, subsequent encounter  Pleural effusion  Confusion    Rib fracture, subacute, with expected healing. Confusion is related to use of tramadol. There is no significant pleural effusion or requirement for medical admission.  Nursing Notes Reviewed/ Care Coordinated Applicable Imaging Reviewed Interpretation of Laboratory Data incorporated into ED treatment  The patient appears reasonably screened and/or stabilized for discharge and I doubt any other medical condition or other [facility] requiring further screening, evaluation, or treatment in the ED at this time prior to discharge.  Plan: Home Medications- usual;  Home Treatments-Home Health Arrangements made,  rest; return here if the recommended treatment, does not improve the symptoms; Recommended follow up- PCP 1 week   Flint Melter, MD 07/08/14 [phone]

## 2014-07-08 NOTE — ED Notes (Signed)
Provided pt with [place] sandwich meal per nurse.

## 2014-07-08 NOTE — ED Notes (Addendum)
Pt continues to be monitored by 12 lead , blood pressure, and pulse ox. Pts family remains at bedside. Dr. Effie ShyWentz at bedside.

## 2014-07-08 NOTE — Progress Notes (Signed)
Spoke with pt's daughter and SIL re: pt's disposition.  Explained to pt's family that pt's recent admission was not a qualifying stay under Medicare guidelines, therefore she could not be placed from the ED.  Pt not able to privately pay for care and would not qualify for Medicaid.  Discussed FMLA benefits with pt's daughter, as well as Engineer, materialshiring private duty care and HHC.  RNCM consulted as well.

## 2014-07-08 NOTE — ED Notes (Signed)
pts iv removed and pt getting prepared for discharge. Pt awaiting discharge paperwork at bedside.

## 2014-07-08 NOTE — ED Notes (Addendum)
Pt from home via [facility] with c/o rib pain s/p fall last Tuesday with 2 rib fractures.  Family is concerned that pt will fall again because they are not able to be around all the time while recovering and on pain medications.  Pt last took 100 mg tramadol at 1330 today.  Pt in NAD.

## 2014-07-08 NOTE — ED Notes (Signed)
Pt placed into gown and on monitor upon arrival to room. Pt monitored by 5 lead, blood pressure, and pulse ox.  

## 2014-07-08 NOTE — Progress Notes (Addendum)
  CARE MANAGEMENT ED NOTE 07/09/2014  Patient:  Katherine Mcmahon, Katherine Mcmahon   Account Number:  [id number]  Date Initiated:  07/08/2014  Documentation initiated by:  [facility]  Subjective/Objective Assessment:   Pt presented to [facility] ED with s/p fall. Patient had recent fall andd was hopitalized with fractured ribs. Recommendation is HH services     Subjective/Objective Assessment Detail:     Action/Plan:   Referral for [facility]  PT/OT/ RN sent to [facility]   Action/Plan Detail:   Anticipated DC Date:  07/08/2014     Status Recommendation to Physician:   Result of Recommendation:  Agreed  Other ED Services  Appropriate Status Consult     [facility] Choice  HOME HEALTH   Choice offered to / List presented to:  C-1 Patient     [place] arranged  [place]      [place].    Status of service:  Completed, signed off  ED Comments:   ED Comments Detail:  07/09/2014 19:20 W. Stann Mainland RN BSN [phone] Contacted patient checking on post discharge. Patient's Daughter Jonelle Sidle reported that [facility] scheduled to come out tomorrow 07/10/14. No further ED CM needs identified.  07/08/2014 18:31 Wendi Maya RN BSN [phone] ED CM consulted by Benay Pillow on [place] [address] concerning Transitional supportive care. Patient presented to [facility] ED with recurrent falls, patient was recently discharged from [facility] s/p fall with rib fractures for pain control. Patient evaluation in ED was unremarkable.  Patient has Medicare. Met with patient and family at bedside, confirmed information. Discuss the recommendation for [facility] supportive care. Patient and family are agreeable. Discussed [place] services. Choice given, provided [facility] agency list, Patient decided on [facility] services.. Pt has rolling walker and chair at home.pick up . HH referral order placed for PT. Referral faxed in to [phone] Fax confirmation received. Verified current address and phone number with patient. Discussed discharge plan with Dr. Eulis Foster and  Levada Dy RN agrees with discharge plan. Informed patient and. Informed patient that  someone from [facility] will contact her at the number provided within 24 -48hr to set up a visit time. [facility] brochure given with contact number to call if any questions or concerns should arise regarding [place] services. CM will follow up tomorrow.

## 2014-07-08 NOTE — ED Provider Notes (Signed)
80 y.o. Female with fall yesterday who presented to day with severe ruq pain.  She is a smoker.   Imaging obtained and two rib fractures seen as well as right hemothorax.  History/physical exam/procedure(s) were performed by non-physician practitioner and as supervising physician I was immediately available for consultation/collaboration. I have reviewed all notes and am in agreement with care and plan.   Hilario Quarryanielle S Jeorgia Helming, MD 07/08/14 [phone]

## 2014-07-08 NOTE — ED Notes (Signed)
Pt remains monitored by blood pressure and pulse ox. Pt remains at bedside.

## 2014-10-16 ENCOUNTER — Emergency Department (HOSPITAL_COMMUNITY): Payer: Medicare Other

## 2014-10-16 ENCOUNTER — Emergency Department (HOSPITAL_COMMUNITY)
Admission: EM | Admit: 2014-10-16 | Discharge: 2014-10-16 | Disposition: A | Discharge status: Home / Self Care | Payer: Medicare Other | Attending: Emergency Medicine | Admitting: Emergency Medicine

## 2014-10-16 ENCOUNTER — Encounter (HOSPITAL_COMMUNITY): Payer: Self-pay | Admitting: *Deleted

## 2014-10-16 DIAGNOSIS — Z79899 Other long term (current) drug therapy: Secondary | ICD-10-CM | POA: Diagnosis not present

## 2014-10-16 DIAGNOSIS — W19XXXA Unspecified fall, initial encounter: Secondary | ICD-10-CM

## 2014-10-16 DIAGNOSIS — Y9301 Activity, walking, marching and hiking: Secondary | ICD-10-CM | POA: Diagnosis not present

## 2014-10-16 DIAGNOSIS — Z8669 Personal history of other diseases of the nervous system and sense organs: Secondary | ICD-10-CM | POA: Insufficient documentation

## 2014-10-16 DIAGNOSIS — S32512A Fracture of superior rim of left pubis, initial encounter for closed fracture: Secondary | ICD-10-CM

## 2014-10-16 DIAGNOSIS — S42002A Fracture of unspecified part of left clavicle, initial encounter for closed fracture: Secondary | ICD-10-CM

## 2014-10-16 DIAGNOSIS — X58XXXA Exposure to other specified factors, initial encounter: Secondary | ICD-10-CM | POA: Insufficient documentation

## 2014-10-16 DIAGNOSIS — Z720 Tobacco use: Secondary | ICD-10-CM | POA: Insufficient documentation

## 2014-10-16 DIAGNOSIS — S32502A Unspecified fracture of left pubis, initial encounter for closed fracture: Secondary | ICD-10-CM | POA: Insufficient documentation

## 2014-10-16 DIAGNOSIS — S4992XA Unspecified injury of left shoulder and upper arm, initial encounter: Secondary | ICD-10-CM | POA: Diagnosis present

## 2014-10-16 DIAGNOSIS — S42032A Displaced fracture of lateral end of left clavicle, initial encounter for closed fracture: Secondary | ICD-10-CM | POA: Insufficient documentation

## 2014-10-16 DIAGNOSIS — Y92014 Private driveway to single-family (private) house as the place of occurrence of the external cause: Secondary | ICD-10-CM | POA: Diagnosis not present

## 2014-10-16 DIAGNOSIS — Z7982 Long term (current) use of aspirin: Secondary | ICD-10-CM | POA: Diagnosis not present

## 2014-10-16 DIAGNOSIS — Y92009 Unspecified place in unspecified non-institutional (private) residence as the place of occurrence of the external cause: Secondary | ICD-10-CM

## 2014-10-16 DIAGNOSIS — Y998 Other external cause status: Secondary | ICD-10-CM | POA: Insufficient documentation

## 2014-10-16 NOTE — Progress Notes (Signed)
  CARE MANAGEMENT ED NOTE 10/16/2014  Patient:  Katherine Mcmahon,Katherine Mcmahon   Account Number:  [id number]  Date Initiated:  10/16/2014  Documentation initiated by:  Edd ArbourGIBBS,Talisa Petrak  Subjective/Objective Assessment:   80 yr old medicare/bcbs of [place] pt c/o fall on 10/14/14 and pain of left hip     Subjective/Objective Assessment Detail:   pcp Dr Merlene LaughterHal Stoneking  imaging shows pubic fx and minimal displaced left distal clavicle fx  Dtr at bedside supportive  Pt [facility] (had their services in August 2015 with "broken wrist")  Pt states she has a walker & cane at home Has been using walker since fall on 10/14/14  Dtr request ED visit status to be fax to pcp and ortho provider prior to  pt going to appts     Action/Plan:   ED CM consulted by RN, Lequita HaltMorgan for EDP mcmanus, ED unit secretaries, Daine GipKaren & Lisa CM reviewed EPIC labs results Spoke with pt, daughter and female at bedside  Dtr inquired about making appt for ortho CM consulted with EDP Pt to make own appt   Action/Plan Detail:   Cm updated pt she is to make own appt pending her schedule 1224  spoke with Baxter HireKristen of [facility] about HHRN/PT/aide and pt wanting evaluation for platform walker   Anticipated DC Date:  10/16/2014     Status Recommendation to Physician:   Result of Recommendation:    Other ED Services  Consult Working Plan    DC Planning Services  Other  Outpatient Services - Pt will follow up   [facility]  [facility]   Choice offered to / List presented to:  C-1 Patient     HH arranged  HH-1 RN  HH-2 PT  HH-4 NURSE'S AIDE      HH agency  [facility]    Status of service:  Completed, signed off  ED Comments:  Pt will be evaluated for need of a platform walker by [facility] staff Notes entered for [facility] staff in their data base  ED Comments Detail:  10/16/14 1230 Cm spoke with staff at pcp office to see if access available  to EPIC to see pt [facility] ED visit information Only certain RNs in office has access.  Provided fax as [phone] for office To be faxed pending completion of EDP note completion

## 2014-10-16 NOTE — Discharge Instructions (Signed)
°Emergency Department Resource Guide °1) Find a Doctor and Pay Out of Pocket °Although you won't have to find out who is covered by your insurance plan, it is a good idea to ask around and get recommendations. You will then need to call the office and see if the doctor you have chosen will accept you as a new patient and what types of options they offer for patients who are self-pay. Some doctors offer discounts or will set up payment plans for their patients who do not have insurance, but you will need to ask so you aren't surprised when you get to your appointment. ° °2) Contact Your Local Health Department °Not all health departments have doctors that can see patients for sick visits, but many do, so it is worth a call to see if yours does. If you don't know where your local health department is, you can check in your phone book. The CDC also has a tool to help you locate your state's health department, and many state websites also have listings of all of their local health departments. ° °3) Find a Walk-in Clinic °If your illness is not likely to be very severe or complicated, you may want to try a walk in clinic. These are popping up all over the country in pharmacies, drugstores, and shopping centers. They're usually staffed by nurse practitioners or physician assistants that have been trained to treat common illnesses and complaints. They're usually fairly quick and inexpensive. However, if you have serious medical issues or chronic medical problems, these are probably not your best option. ° °No Primary Care Doctor: °- Call Health Connect at  832-8000 - they can help you locate a primary care doctor that  accepts your insurance, provides certain services, etc. °- Physician Referral Service- 1-800-533-3463 ° °Chronic Pain Problems: °Organization         Address  Phone   Notes  °[facility]  (336) 297-2271 Patients need to be referred by their primary care doctor.  ° °Medication  Assistance: °Organization         Address  Phone   Notes  °[place] [facility] [address] °[place], [place] [address] (336) 641-8030 --Must be a resident of [place] °-- Must have NO insurance coverage whatsoever (no Medicaid/ Medicare, etc.) °-- The pt. MUST have a primary care doctor that directs their care regularly and follows them in the community °  °[facility]  (866) 331-1348   °[employer] Way  (888) 892-1162   ° °Agencies that provide inexpensive medical care: °Organization         Address  Phone   Notes  °[facility]  (336) 832-8035   °[facility]    (336) 832-7272   °[facility] [address] °[place], [place] [address] (336) 832-4777   °[facility] [address], °[place] (336) 271-4999   °[facility]    (336) 373-0678   °[facility]    (336) 272-1050   °[facility] ° [address], [place] Phone:  (336) 832-4444, Fax:  (336) 832-4440 Hours of Operation:  9 am - 6 pm, M-F.  Also accepts Medicaid/Medicare and self-pay.  °[facility] ° [address], [place] Phone: (336) 832-3150, Fax: (336) [phone]. Hours of Operation:  8:30 am - 5:30 pm, M-F.  Also accepts Medicaid and self-pay.  °[facility] [place] [address], [place] Phone: (336) 878-6027   °[facility] [address], [place], [place] (336)723-1848, Ext. 123 Mondays & Thursdays: 7-9 AM.  First 15 patients are seen on a first come, first serve basis. °  ° °Medicaid-accepting [place] Providers: ° °Organization         Address  Phone   Notes  °[facility] [address], [place] (336) 641-2100 Also accepts self-pay patients.  °[facility] [address], [place] ° (336) 856-9996   °[facility] [address], [place]  (336) 288-8857   °[facility] [address] [place] [address], [place] (336) 299-7000   °Veita Bland [address], [place]  ° (336) 373-1557 Only accepts [place] Access Medicaid patients after they have their name applied to their card.  ° °Self-Pay (no insurance) in [place]: ° °Organization         Address  Phone   Notes  °Sickle Cell Patients, [facility] [address], [place] (336) 832-1970   °[facility] [address], [place] (336) 832-4400   °[facility] ° [address], [place] (336) 992-4800   °[facility]/Dr. Osei-Bonsu ° [address] [place] [address], [place] or [address], [place] (336) 841-8500 Phone number for both [place] and [place] locations is the same.  °[facility] [address], [place] (336) 299-0000   °[facility] [address] [place] [address], [place] or [address] (336) 852-7530 °(336) 878-2260   °[facility] [address], [place] (336) 350-1642, phone; (336) 294-5005, fax Sees patients 1st and 3rd Saturday of every month.  Must not qualify for public or private insurance (i.e. Medicaid, Medicare, [place] Health Choice, Veterans' Benefits) • Household income should be no more than 200% of the poverty level •The clinic cannot treat you if you are pregnant or think you are pregnant • Sexually transmitted diseases are not treated at the clinic.  ° ° °Dental Care: °Organization         Address  Phone  Notes  °[place] [facility] [address], [place] (336) 641-6152 Accepts children up to age 21 who are enrolled in Medicaid or [place] Health Choice; pregnant women with a Medicaid card; and children who have applied for Medicaid or [place] Health Choice, but were declined, whose parents can pay a reduced fee at time of service.  °[place]  [facility] [place]  [address], [place] (336) 641-7733 Accepts children up to age 21 who are enrolled in Medicaid or [place] Health Choice; pregnant women with a Medicaid card; and children who have applied for Medicaid or [place] Health Choice, but were declined, whose parents can pay a reduced fee at time of service.  °[facility] ° [address], [place] (336) 641-4533 Patients are seen by appointment only. Walk-ins are not accepted. [facility] will see patients 18 years of age and older. °Monday - Tuesday (8am-5pm) °Most Wednesdays (8:30-5pm) °$30 per visit, cash only  °[facility] ° [address], [place] (336) 641-4533 Patients are seen by appointment only. Walk-ins are not accepted. [facility] will see patients 18 years of age and older. °One   Wednesday Evening (Monthly: Volunteer Based).  $30 per visit, cash only  °[facility]  (919) 537-3737 for adults; Children under age 4, call Graduate Pediatric Dentistry at (919) [phone]. Children aged 4-14, please call (919) [phone] to request a pediatric application. ° Dental services are provided in all areas of dental care including fillings, crowns and bridges, complete and partial dentures, implants, gum treatment, root canals, and extractions. Preventive care is also provided. Treatment is provided to both adults and children. °Patients are selected via a lottery and there is often a waiting list. °  °[facility] [address], °[place] ° (336) 763-8833 [email or web address] °  °[facility] [address], [place], [place] (336)723-1848, Ext. 123 Second and Fourth Thursday of each month, opens at 6:30 AM; Clinic ends at 9 AM.  Patients are seen on a first-come first-served basis, and a limited number are seen during each clinic.  ° °[facility] ° [address], [place], [place] (336) 723-7904    Eligibility Requirements °You must have lived in [place], or [place] counties for at least the last three months. °  You cannot be eligible for state or federal sponsored healthcare insurance, including Veterans Administration, Medicaid, or Medicare. °  You generally cannot be eligible for healthcare insurance through your employer.  °  How to apply: °Eligibility screenings are held every Tuesday and Wednesday afternoon from 1:00 pm until 4:00 pm. You do not need an appointment for the interview!  °[facility] [address], [place], [place] 336-631-2330   °[place]  336-342-8273   °[place]  336-703-3100   °[facility]  336-570-6415   ° °Behavioral Health Resources in the Community: °Intensive Outpatient Programs °Organization         Address  Phone  Notes  °[facility] [address], [place], [place] 336-878-6098   °[facility] [address], [place], [place] 336-832-9800   °[facility] [address], [place], [place] ° 336-882-2125   °[place] [address],  °[place], [place] 1-800-853-5163 or 336-641-4981   °Substance Abuse Resources °Organization         Address  Phone  Notes  °[facility]  336-882-2125   °[facility]  336-784-9470   °[facility]  336-285-9073   °[facility]  336-845-3988   °Residential & Outpatient Substance Abuse Program  1-800-659-3381   °Psychological Services °Organization         Address  Phone  Notes  °[facility]  336- 832-9600   °[facility]  336- 378-7881   °[place] [address], [place] 1-800-853-5163 or 336-641-4981   ° °Mobile Crisis Teams °Organization         Address  Phone  Notes  °[facility], Mobile Crisis Care Unit  1-877-626-1772   °Assertive °[facility] ° [address]  [place], [place] 336-834-9664   °Sharon DeEsch [address] °[place] 336-554-5454   ° °Self-Help/Support Groups °Organization         Address  Phone             Notes  °[facility] of  - variety of support groups  336- 373-1402 Call for more information  °Narcotics Anonymous (NA), [facility] [address], °[place]  2 meetings at this location  ° °  Residential Treatment Programs Organization         Address  Phone  Notes  [facility] [address],    [place]  [phone]   [facility]  [address], [place] [phone], [place], [place] 284-132-4401([phone])[phone]   [facility] [address], [place] [phone] Admissions: 8am-3pm M-F  [facility] [address]    [place], [place] [phone]) [phone]   [facility] [address], [place], [place] [phone]   [facility] [address]  [place], [place] [phone]   [facility] Intensive Outpatient [address], [place], [place] [phone]   [facility] ([facility] [address]  [place], [place] [phone] or [phone]   [facility] ([facility]) [address] [place], [place] [phone](513) [phone] Accepts Medicaid  [facility] [address]  [place] [phone] Substance Abuse/Addiction Treatment   [facility] Resources Organization         Address  Phone  Notes  [facility]  367-569-9103(888) [phone]   Angie FavaJulie Brannon, PhD [address], [address] [place], [place]   [phone](336) [phone] or [phone](336) [phone]   [facility]   [address] [place], [place] [phone](336) [phone]   [facility] [address], [place], [place] [phone](336) [phone] Insurance/Medicaid/sponsorship through [facility]  [facility] [address]                                    [place], [place] [phone]) [phone] Therapy/tele-psych/case    [facility] [address]   [place], [place] [phone](336) [phone]    Dr. Lolly MustacheArfeen  [phone]) [phone]   [facility]  [employer] [facility] 1) [address], [place] 2) [address], [place] 3)  [address], [place] [phone](336) [phone] [phone](336) [phone]  [phone](336) [phone]   [facility] Child Abuse Hotline 224-652-8119(336) [phone] or 3126784436(336) (628)[phone] (After Hours)       Take over the counter tylenol, as directed on packaging, as needed for discomfort. Walk with your walker until you are seen in follow up. Wear the sling on your left arm for comfort.  Apply moist heat or ice to the area(s) of discomfort, for 15 minutes at a time, several times per day for the next few days.  Do not fall asleep on a heating or ice pack.  Call your regular medical doctor and your Orthopedic doctor today to schedule a follow up appointment this week.  Return to the Emergency Department immediately if worsening.

## 2014-10-16 NOTE — Progress Notes (Signed)
10/16/14 1749 Cm faxed clinicals to her pcp and Dr Thomasena Edisollins offices (per pt dtr request)  with fax confirmation received at 1734 & 1742

## 2014-10-16 NOTE — ED Notes (Signed)
Per Patient's daughter patient fell on Monday 10/14/2014 at 12.30 in the afternoon. Pt experienced fall to left side of body. Pt fell on grass.

## 2014-10-16 NOTE — ED Notes (Signed)
Pt completed ambulation with walker without distress or complaints.

## 2014-10-16 NOTE — ED Provider Notes (Signed)
CSN: [id number]     Arrival date & time 10/16/14  [id number] History   First MD Initiated Contact with Patient 10/16/14 [phone]     Chief Complaint  Patient presents with  . Fall    HPI Pt was seen at 0955. Per pt and her family, c/o sudden onset and resolution of one episode of slip and fall 2 days ago. Pt states she was walking on her driveway and "felt like I was about to fall" so she "twisted myself so I could fall on the grass instead of the concrete." Pt states she landed on the left side of her body, and she was able to stand after the fall. Pt's daughter noticed pt was ambulating with her walker (not her baseline) due to c/o left hip "pain." Pt also c/o left clavicle area "pain." Pt denies hitting head, no LOC, no AMS, no neck or back pain, no CP/SOB, no abd pain, no focal motor weakness, no tingling/numbness in extremities.    Past Medical History  Diagnosis Date  . Cataract    History reviewed. No pertinent past surgical history.   Family History  Problem Relation Age of Onset  . Cancer Mother     colon  . Heart attack Father   . Cancer Sister     bone and breast   History  Substance Use Topics  . Smoking status: Current Every Day Smoker -- 0.50 packs/day for 64 years    Types: Cigarettes  . Smokeless tobacco: Not on file  . Alcohol Use: No    Review of Systems ROS: Statement: All systems negative except as marked or noted in the HPI; Constitutional: Negative for fever and chills. ; ; Eyes: Negative for eye pain, redness and discharge. ; ; ENMT: Negative for ear pain, hoarseness, nasal congestion, sinus pressure and sore throat. ; ; Cardiovascular: Negative for chest pain, palpitations, diaphoresis, dyspnea and peripheral edema. ; ; Respiratory: Negative for cough, wheezing and stridor. ; ; Gastrointestinal: Negative for nausea, vomiting, diarrhea, abdominal pain, blood in stool, hematemesis, jaundice and rectal bleeding. . ; ; Genitourinary: Negative for dysuria, flank pain and  hematuria. ; ; Musculoskeletal: +left shoulder/clavicle pain, left hip pain. Negative for back pain and neck pain. Negative for swelling and deformity.; ; Skin: +bruising. Negative for pruritus, rash, abrasions, blisters, and skin lesion.; ; Neuro: Negative for headache, lightheadedness and neck stiffness. Negative for weakness, altered level of consciousness , altered mental status, extremity weakness, paresthesias, involuntary movement, seizure and syncope.      Allergies  Review of patient's allergies indicates no known allergies.  Home Medications   Prior to Admission medications   Medication Sig Start Date End Date Taking? Authorizing Provider  aspirin EC 81 MG tablet Take 81 mg by mouth daily.    Historical Provider, MD  beta carotene w/minerals (OCUVITE) tablet Take 1 tablet by mouth daily.    Historical Provider, MD  traMADol (ULTRAM) 50 MG tablet Take 50-100 mg by mouth every 6 (six) hours as needed (pain).    Historical Provider, MD   BP 111/93 mmHg  Pulse 108  Temp(Src) 98.2 F (36.8 C)  Resp 16  SpO2 92% Physical Exam  1000: Physical examination:  Nursing notes reviewed; Vital signs and O2 SAT reviewed;  Constitutional: Well developed, Well nourished, Well hydrated, In no acute distress; Head:  Normocephalic, atraumatic; Eyes: EOMI, PERRL, No scleral icterus; ENMT: Mouth and pharynx normal, Mucous membranes moist; Neck: Supple, Full range of motion, No lymphadenopathy; Cardiovascular: Regular rate and  rhythm, No gallop; Respiratory: Breath sounds clear & equal bilaterally, No wheezes.  Speaking full sentences with ease, Normal respiratory effort/excursion; Chest: +left clavicle tender to palp with faint ecchymosis. No deformity. Movement normal. No abrasions.; Abdomen: Soft, Nontender, Nondistended, Normal bowel sounds. No abrasions or ecchymosis.; Genitourinary: No CVA tenderness; Spine:  No midline CS, TS, LS tenderness. No abrasions or ecchymosis. Extremities: Pulses normal,  pelvis stable. +left hip TTP, no deformity. NT left knee/ankle/foot. +left clavicle TTP with faint ecchymosis. No palp soft tissue crepitus. NT left shoulder/elbow. No edema, No calf edema or asymmetry.; Neuro: AA&Ox3, +HOH, otherwise major CN grossly intact.  Speech clear. No gross focal motor or sensory deficits in extremities.; Skin: Color normal, Warm, Dry.   ED Course  Procedures     EKG Interpretation None      MDM  MDM Reviewed: previous chart, nursing note and vitals Interpretation: x-ray     Dg Clavicle Left 10/16/2014   CLINICAL DATA:  Larey SeatFell and injured left shoulder on 10/14/2014  EXAM: LEFT SHOULDER - 2+ VIEW; LEFT CLAVICLE - 2+ VIEWS  COMPARISON:  None.  FINDINGS: There is a minimally displaced distal clavicle fracture with approximately 1 half shaft width of superior displacement. The [facility] joint is intact. The glenohumeral joint is maintained. The visualized left ribs are intact and the left lung apex is clear. Multiple remote healed rib fractures are noted.  IMPRESSION: Minimally displaced left distal clavicle fracture.   Electronically Signed   By: Loralie ChampagneMark  Gallerani M.D.   On: 10/16/2014 11:06   Dg Hip Complete Left 10/16/2014   CLINICAL DATA:  80 year old female who fell 2 days ago outside. Left hip pain. Initial encounter.  EXAM: LEFT HIP - COMPLETE 2+ VIEW  COMPARISON:  Left femur series 6 06/2008.  FINDINGS: Chronic bilateral pubic rami fractures. However, suspicious superimposed lucency of the left superior pubic ramus tracking toward the acetabulum (arrows).  Elsewhere the pelvis appears intact. Grossly intact proximal right femur. Proximal left femur is stable and intact.  IMPRESSION: 1. Acute on chronic left pubic ramus fracture, the acute lucency tracking toward the left acetabulum. 2. No acute fracture or dislocation in the proximal left femur. Chronic right pubic rami fractures.   Electronically Signed   By: Augusto GambleLee  Hall M.D.   On: 10/16/2014 11:09   Dg Shoulder  Left 10/16/2014   CLINICAL DATA:  Larey SeatFell and injured left shoulder on 10/14/2014  EXAM: LEFT SHOULDER - 2+ VIEW; LEFT CLAVICLE - 2+ VIEWS  COMPARISON:  None.  FINDINGS: There is a minimally displaced distal clavicle fracture with approximately 1 half shaft width of superior displacement. The [facility] joint is intact. The glenohumeral joint is maintained. The visualized left ribs are intact and the left lung apex is clear. Multiple remote healed rib fractures are noted.  IMPRESSION: Minimally displaced left distal clavicle fracture.   Electronically Signed   By: Loralie ChampagneMark  Gallerani M.D.   On: 10/16/2014 11:06    1300: CM has evaluated pt in the ED and set up Home Health services. Pt insistent she "wants to go home right now." Pt has ambulated with her walker with steady gait, easy resps, NAD. Denied any c/o pain. Dx and testing d/w pt and family.  Questions answered.  Verb understanding, agreeable to d/c home with outpt f/u.   Samuel JesterKathleen Virgilia Quigg, DO 10/19/14 [id number]

## 2015-05-12 ENCOUNTER — Other Ambulatory Visit: Payer: Self-pay

## 2015-05-12 DIAGNOSIS — Z1231 Encounter for screening mammogram for malignant neoplasm of breast: Secondary | ICD-10-CM

## 2015-05-28 ENCOUNTER — Encounter (INDEPENDENT_AMBULATORY_CARE_PROVIDER_SITE_OTHER): Payer: Medicare Other | Admitting: Ophthalmology

## 2015-05-28 DIAGNOSIS — H43813 Vitreous degeneration, bilateral: Secondary | ICD-10-CM | POA: Diagnosis not present

## 2015-05-28 DIAGNOSIS — H3532 Exudative age-related macular degeneration: Secondary | ICD-10-CM

## 2015-06-16 ENCOUNTER — Ambulatory Visit: Payer: Medicare Other

## 2015-06-17 ENCOUNTER — Ambulatory Visit: Payer: Medicare Other

## 2015-06-19 ENCOUNTER — Ambulatory Visit
Admission: RE | Admit: 2015-06-19 | Discharge: 2015-06-19 | Disposition: A | Discharge status: Home / Self Care | Payer: Medicare Other | Source: Ambulatory Visit

## 2015-06-19 DIAGNOSIS — Z1231 Encounter for screening mammogram for malignant neoplasm of breast: Secondary | ICD-10-CM

## 2015-08-02 IMAGING — CR DG HIP (WITH OR WITHOUT PELVIS) 2-3V*L*
3 series · 3 of 3 positions shown · non-contrast
Comparison: Left femur series [DATE].

CLINICAL DATA: 87-year-old female who fell 2 days ago outside. Left
hip pain. Initial encounter.

EXAM:
LEFT HIP - COMPLETE 2+ VIEW

[t pelvis ap]
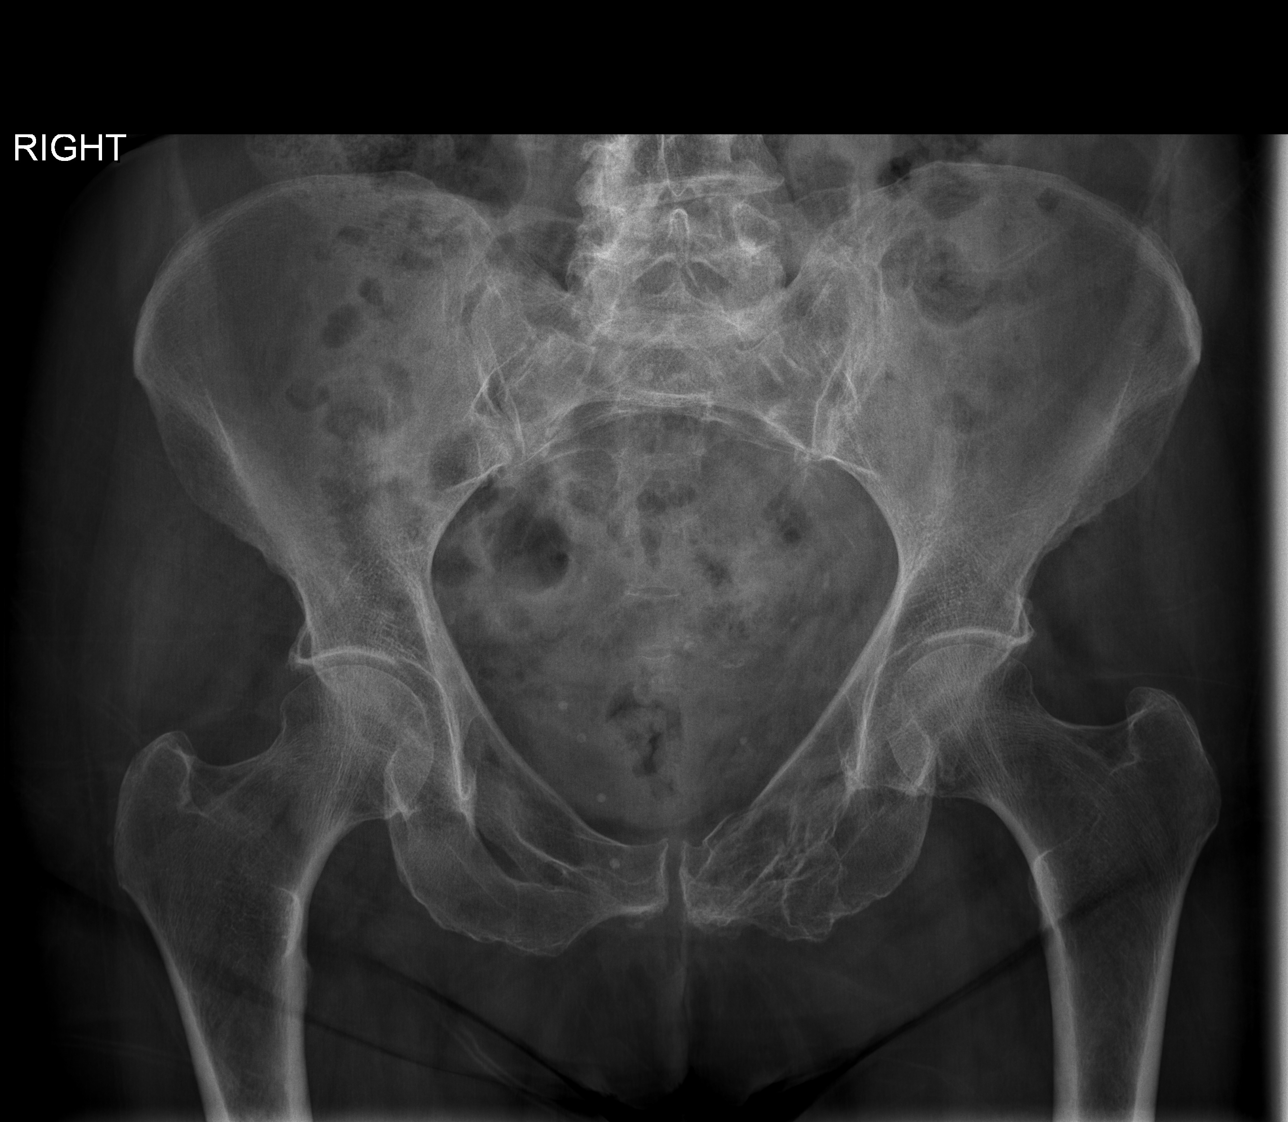

[t hip ap left]
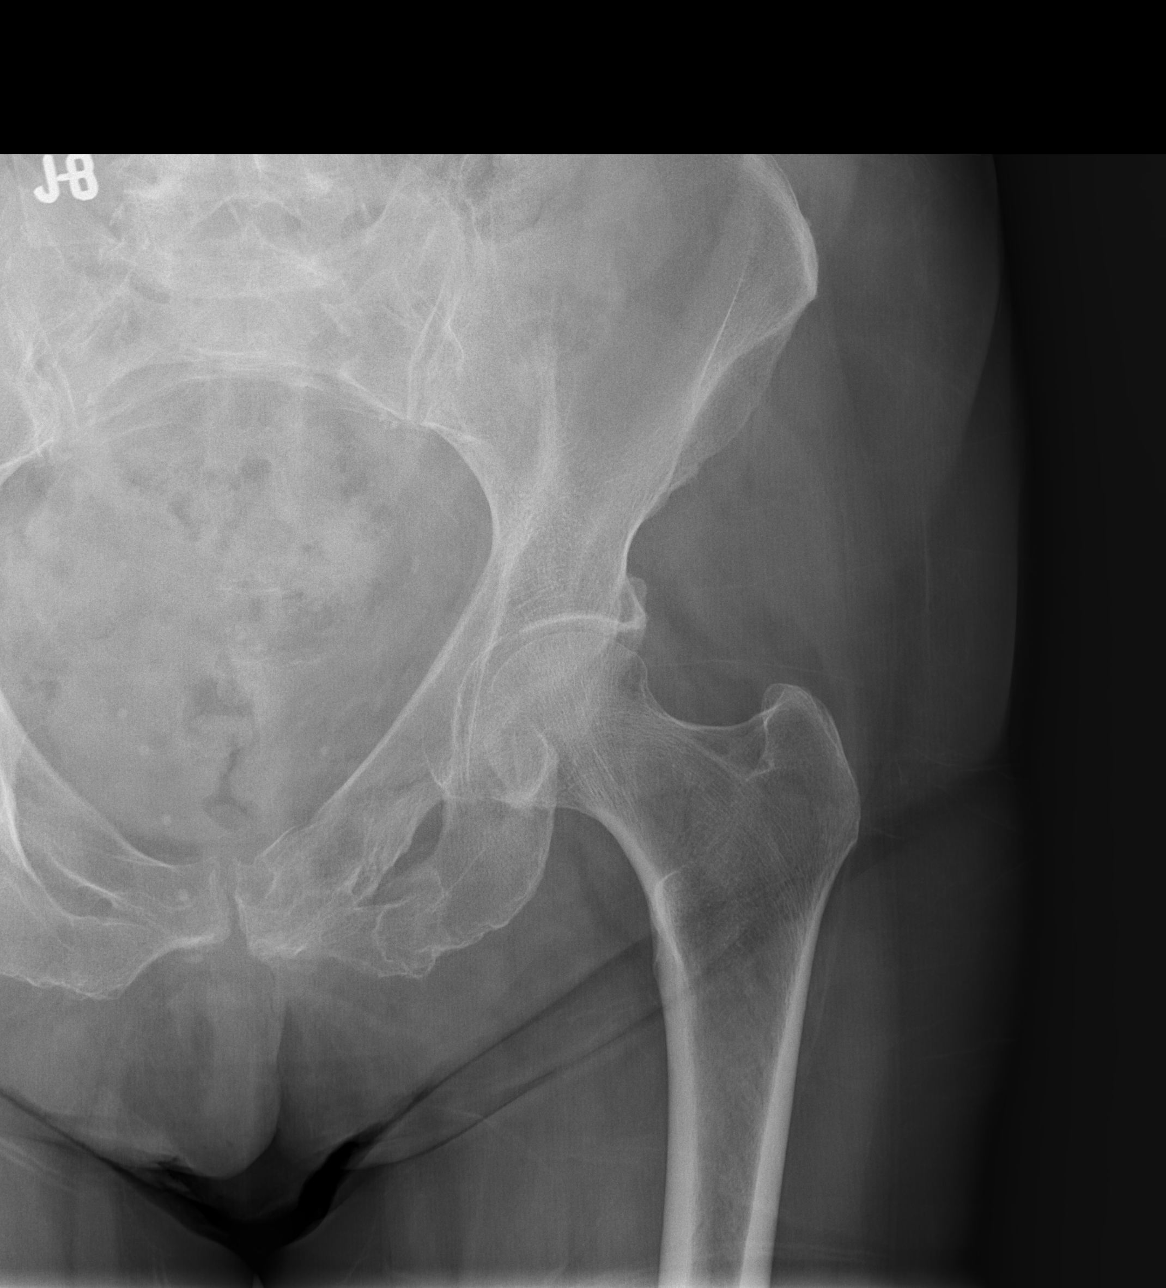

[t hip frog leg left]
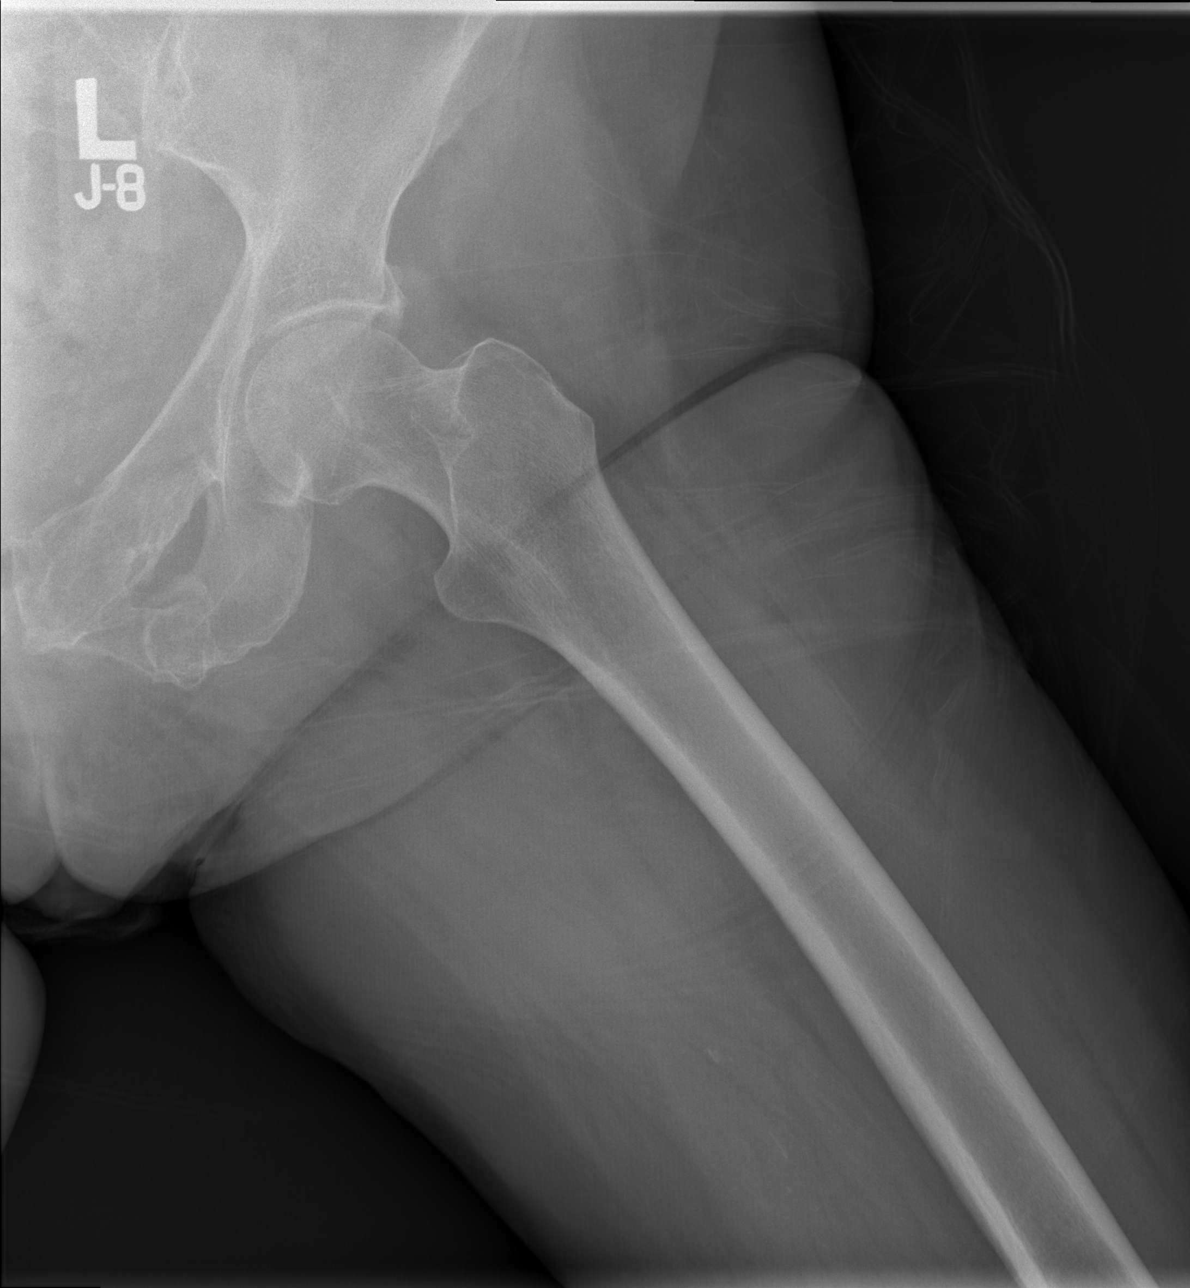

[3 of 3 positions shown; findings below may reference images not displayed]

FINDINGS: Chronic bilateral pubic rami fractures. However, suspicious
superimposed lucency of the left superior pubic ramus tracking
toward the acetabulum (arrows).

Elsewhere the pelvis appears intact. Grossly intact proximal right
femur. Proximal left femur is stable and intact.
IMPRESSION: 1. Acute on chronic left pubic ramus fracture, the acute lucency
tracking toward the left acetabulum.
2. No acute fracture or dislocation in the proximal left femur.
Chronic right pubic rami fractures.

## 2015-08-02 IMAGING — CR DG SHOULDER 2+V*L*
2 series · 2 of 2 positions shown · non-contrast
Comparison: None.

CLINICAL DATA: Fell and injured left shoulder on 10/14/2014

EXAM:
LEFT SHOULDER - 2+ VIEW; LEFT CLAVICLE - 2+ VIEWS

[t shoulder external left]
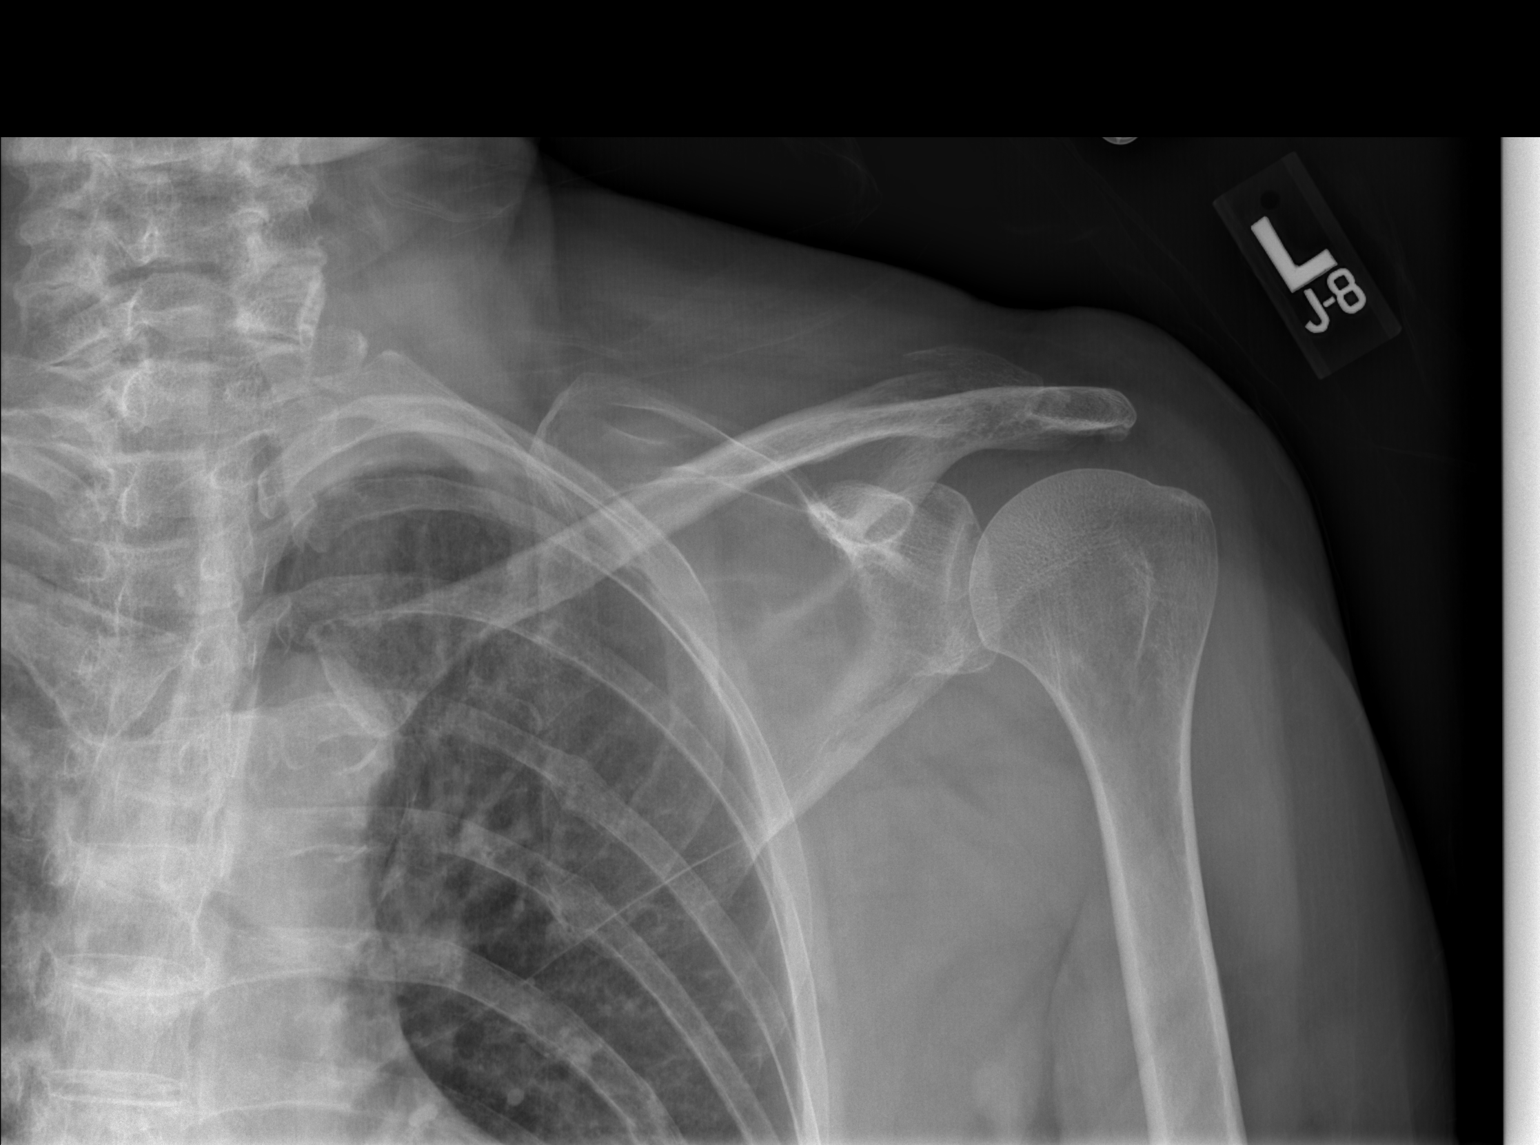

[t shoulder y-view left]
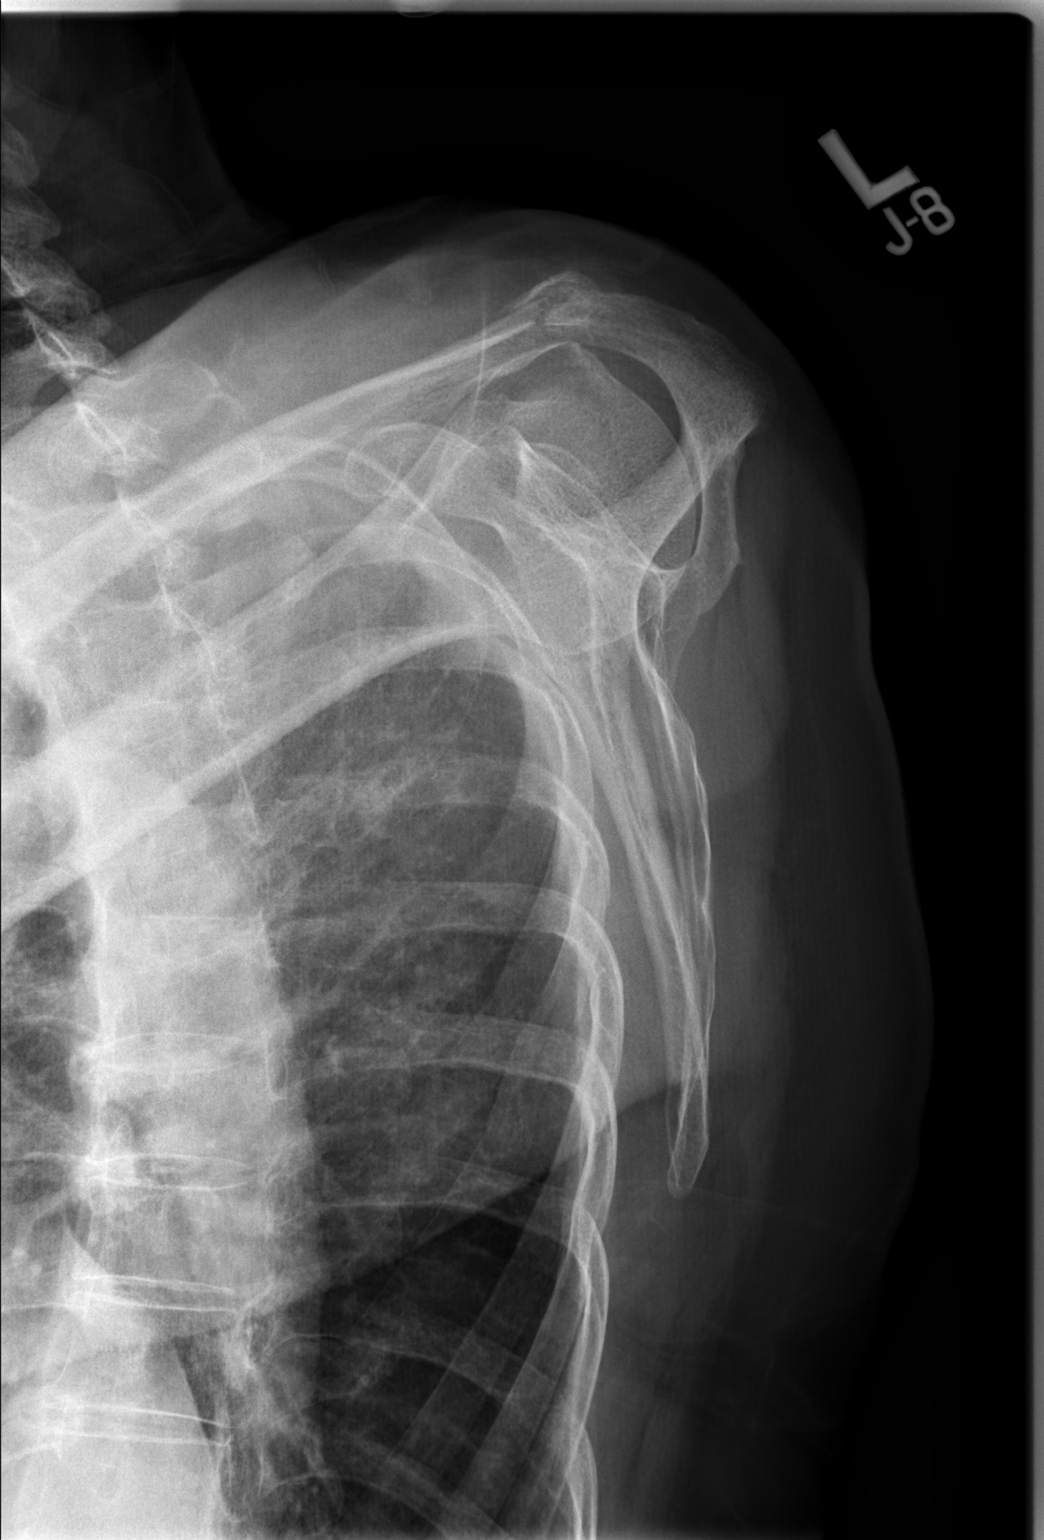

[2 of 2 positions shown; findings below may reference images not displayed]

FINDINGS: There is a minimally displaced distal clavicle fracture with
approximately 1 half shaft width of superior displacement. The AC
joint is intact. The glenohumeral joint is maintained. The
visualized left ribs are intact and the left lung apex is clear.
Multiple remote healed rib fractures are noted.
IMPRESSION: Minimally displaced left distal clavicle fracture.

## 2016-03-01 ENCOUNTER — Other Ambulatory Visit: Payer: Self-pay

## 2016-03-01 DIAGNOSIS — Z1231 Encounter for screening mammogram for malignant neoplasm of breast: Secondary | ICD-10-CM

## 2016-06-21 ENCOUNTER — Ambulatory Visit: Payer: Medicare Other

## 2016-06-23 ENCOUNTER — Ambulatory Visit
Admission: RE | Admit: 2016-06-23 | Discharge: 2016-06-23 | Disposition: A | Discharge status: Home / Self Care | Payer: Medicare Other | Source: Ambulatory Visit

## 2016-06-23 DIAGNOSIS — Z1231 Encounter for screening mammogram for malignant neoplasm of breast: Secondary | ICD-10-CM

## 2016-07-14 ENCOUNTER — Telehealth: Payer: Self-pay

## 2016-07-14 ENCOUNTER — Encounter: Payer: Self-pay | Admitting: Physician Assistant

## 2016-07-14 ENCOUNTER — Ambulatory Visit (INDEPENDENT_AMBULATORY_CARE_PROVIDER_SITE_OTHER): Payer: Medicare Other | Admitting: Physician Assistant

## 2016-07-14 ENCOUNTER — Ambulatory Visit (INDEPENDENT_AMBULATORY_CARE_PROVIDER_SITE_OTHER): Payer: Medicare Other

## 2016-07-14 VITALS — BP 108/68 | HR 100 | Temp 97.9°F | Resp 17 | Ht 63.0 in | Wt 132.0 lb

## 2016-07-14 DIAGNOSIS — R109 Unspecified abdominal pain: Secondary | ICD-10-CM

## 2016-07-14 DIAGNOSIS — R079 Chest pain, unspecified: Secondary | ICD-10-CM

## 2016-07-14 DIAGNOSIS — R319 Hematuria, unspecified: Secondary | ICD-10-CM | POA: Diagnosis not present

## 2016-07-14 DIAGNOSIS — N390 Urinary tract infection, site not specified: Secondary | ICD-10-CM

## 2016-07-14 LAB — POCT CBC
Granulocyte percent: 77.3 %G (ref 37–80)
HCT, POC: 37.9 % (ref 37.7–47.9)
Hemoglobin: 13 g/dL (ref 12.2–16.2)
Lymph, poc: 1.3 (ref 0.6–3.4)
MCH: 30 pg (ref 27–31.2)
MCHC: 34.3 g/dL (ref 31.8–35.4)
MCV: 87.4 fL (ref 80–97)
MID (CBC): 0.4 (ref 0–0.9)
MPV: 7 fL (ref 0–99.8)
POC Granulocyte: 5.9 (ref 2–6.9)
POC LYMPH PERCENT: 17.1 %L (ref 10–50)
POC MID %: 5.6 % (ref 0–12)
Platelet Count, POC: 249 10*3/uL (ref 142–424)
RBC: 4.34 M/uL (ref 4.04–5.48)
RDW, POC: 15.1 %
WBC: 7.6 10*3/uL (ref 4.6–10.2)

## 2016-07-14 LAB — POC MICROSCOPIC URINALYSIS (UMFC)

## 2016-07-14 LAB — POCT URINALYSIS DIP (MANUAL ENTRY)
BILIRUBIN UA: NEGATIVE
GLUCOSE UA: NEGATIVE
NITRITE UA: POSITIVE — AB
PH UA: 5.5
SPEC GRAV UA: 1.025
Urobilinogen, UA: 2

## 2016-07-14 LAB — BASIC METABOLIC PANEL
BUN: 23 mg/dL (ref 7–25)
CHLORIDE: 107 mmol/L (ref 98–110)
CO2: 26 mmol/L (ref 20–31)
CREATININE: 1.02 mg/dL — AB (ref 0.60–0.88)
Calcium: 8.9 mg/dL (ref 8.6–10.4)
GLUCOSE: 120 mg/dL — AB (ref 65–99)
POTASSIUM: 4.1 mmol/L (ref 3.5–5.3)
Sodium: 143 mmol/L (ref 135–146)

## 2016-07-14 MED ORDER — CEPHALEXIN 500 MG PO CAPS: 500.0000 mg | ORAL_CAPSULE | Freq: Two times a day (BID) | ORAL | 14 refills | Status: DC

## 2016-07-14 MED ORDER — METHOCARBAMOL 500 MG PO TABS
500.0000 mg | ORAL_TABLET | Freq: Two times a day (BID) | ORAL | 0 refills | Status: DC
Start: 2016-07-14 — End: 2016-07-31

## 2016-07-14 NOTE — Patient Instructions (Addendum)
  Please take antibiotic to completion.  I am placing a urine culture.  If this does not cover, will contact you. I would like you to be careful with using the methocarbamol.  This can be sedative so you must be careful with walking.     IF you received an x-ray today, you will receive an invoice from [facility]. Please contact [facility] at [phone] with questions or concerns regarding your invoice.   IF you received labwork today, you will receive an invoice from [facility]. Please contact [facility] at [phone] with questions or concerns regarding your invoice.   Our billing staff will not be able to assist you with questions regarding bills from these companies.  You will be contacted with the lab results as soon as they are available. The fastest way to get your results is to activate your My Chart account. Instructions are located on the last page of this paperwork. If you have not heard from us regarding the results in 2 weeks, please contact this office.

## 2016-07-14 NOTE — Progress Notes (Signed)
By signing my name below, I, Mesha Guinyard, attest that this documentation has been prepared under the direction and in the presence of Trena Platt, MD.  Electronically Signed: Arvilla Market, Medical Scribe. 07/14/16. 2:21 PM.  Subjective:    Patient ID: Katherine Mcmahon, female    DOB: August 21, 1927, 82 y.o.   MRN: [id number]  HPI Chief Complaint  Patient presents with   Back Pain    HPI Comments: Katherine Mcmahon is a 82 y.o. female who presents to the [facility] complaining of left  lower back pain that radiates to her left lumbar onset yesterday morning. Pt mentions this is a new pain, and she didn't wake up with back pain. Pt has been laying in the same bed and starts out laying on her left side and then turns on her right side to sleep. Pt has dry coughs and smokes 3/4 pack per day, mostly at night. Pain is alleviated when she lays down. Pt gets macular degeration injections. Pt denies previous injury to her back, previous falls, or doing any strenuous activities. Pt denies pain with breathing, fevers, nausea, and dizziness.  Patient Active Problem List   Diagnosis Date Noted   Right rib fracture 07/05/2014   Fall 07/05/2014   Laceration of arm, right, complicated 03/21/2013   Past Medical History:  Diagnosis Date   Cataract    Osteoarthritis    SHOULDER/HANDS DR GROAT   PVD (peripheral vascular disease) (HCC)    LEFT INTERNAL CAROTID ARTERY  STENOSIS    Seasonal allergies    No past surgical history on file. No Known Allergies Prior to Admission medications   Medication Sig Start Date End Date Taking? Authorizing Provider  aspirin 325 MG tablet Take 325 mg by mouth daily.   Yes Historical Provider, MD  ibuprofen (ADVIL,MOTRIN) 200 MG tablet Take 800 mg by mouth every 6 (six) hours as needed (for pain).   Yes Historical Provider, MD  Influenza vac split quadrivalent PF (FLUARIX) 0.5 ML injection Inject 0.5 mLs into the muscle once.   Yes  Historical Provider, MD  Multiple Vitamin (MULTIVITAMIN WITH MINERALS) TABS tablet Take 1 tablet by mouth daily.   Yes Historical Provider, MD  Tdap (BOOSTRIX) 5-2.5-18.5 LF-MCG/0.5 injection Inject 0.5 mLs into the muscle once.   Yes Historical Provider, MD   Social History   Social History   Marital status: Widowed    Spouse name: N/A   Number of children: N/A   Years of education: N/A   Occupational History   Not on file.   Social History Main Topics   Smoking status: Current Every Day Smoker    Packs/day: 0.75    Years: 66.00    Types: Cigarettes   Smokeless tobacco: Never Used   Alcohol use No   Drug use: No   Sexual activity: No   Other Topics Concern   Not on file   Social History Narrative   No narrative on file    Review of Systems  Constitutional: Negative for fever.  Respiratory: Negative for shortness of breath and wheezing.   Gastrointestinal: Negative for nausea.  Musculoskeletal: Positive for back pain.  Neurological: Negative for dizziness.  ROS reviewed. Otherwise unremarkable, unless listed above.  Objective:  Physical Exam  Constitutional: She appears well-developed and well-nourished. No distress.  HENT:  Head: Normocephalic and atraumatic.  Eyes: Conjunctivae are normal.  Neck: Neck supple.  Cardiovascular: Normal rate, regular rhythm and normal heart sounds.  Exam reveals no gallop  and no friction rub.   No murmur heard. Pulses:      Carotid pulses are 2+ on the right side, and 2+ on the left side.      Radial pulses are 2+ on the right side, and 2+ on the left side.  Pulmonary/Chest: Effort normal.  Minimal intermittent expiratory wheezing bilaterally  Musculoskeletal:  She noted left sided rib pain of the thoracic, how ever "not tender"  Neurological: She is alert.  Skin: Skin is warm and dry.  Psychiatric: She has a normal mood and affect. Her behavior is normal.  Nursing note and vitals reviewed.  BP 108/68 (BP Location:  Right Arm, Patient Position: Sitting, Cuff Size: Normal)    Pulse 100    Temp 97.9 F (36.6 C) (Oral)    Resp 17    Ht 5\' 3"  (1.6 m)    Wt 132 lb (59.9 kg)    SpO2 94%    BMI 23.38 kg/m    Results for orders placed or performed in visit on 07/14/16  POCT CBC  Result Value Ref Range   WBC 7.6 4.6 - 10.2 K/uL   Lymph, poc 1.3 0.6 - 3.4   POC LYMPH PERCENT 17.1 10 - 50 %L   MID (cbc) 0.4 0 - 0.9   POC MID % 5.6 0 - 12 %M   POC Granulocyte 5.9 2 - 6.9   Granulocyte percent 77.3 37 - 80 %G   RBC 4.34 4.04 - 5.48 M/uL   Hemoglobin 13.0 12.2 - 16.2 g/dL   HCT, POC 16.137.9 09.637.7 - 47.9 %   MCV 87.4 80 - 97 fL   MCH, POC 30.0 27 - 31.2 pg   MCHC 34.3 31.8 - 35.4 g/dL   RDW, POC 04.515.1 %   Platelet Count, POC 249 142 - 424 K/uL   MPV 7.0 0 - 99.8 fL  POCT urinalysis dipstick  Result Value Ref Range   Color, UA yellow yellow   Clarity, UA clear clear   Glucose, UA negative negative   Bilirubin, UA negative negative   Ketones, POC UA trace (5) (A) negative   Spec Grav, UA 1.025    Blood, UA trace-intact (A) negative   pH, UA 5.5    Protein Ur, POC =30 (A) negative   Urobilinogen, UA 2.0    Nitrite, UA Positive (A) Negative   Leukocytes, UA Trace (A) Negative  POCT Microscopic Urinalysis ([facility])  Result Value Ref Range   WBC,UR,HPF,POC Many (A) None WBC/hpf   RBC,UR,HPF,POC Few (A) None RBC/hpf   Bacteria Many (A) None, Too numerous to count   Mucus Present (A) Absent   Epithelial Cells, UR Per Microscopy Too numerous to count  None, Too numerous to count cells/hpf   Dg Ribs Unilateral W/chest Left  Result Date: 07/14/2016 CLINICAL DATA:  Left-sided chest pain EXAM: LEFT RIBS AND CHEST - 3+ VIEW COMPARISON:  Chest x-ray of 07/08/2014 FINDINGS: No definite pneumonia is seen. There is minimal atelectasis or scarring at the left lung base, and a tiny left pleural effusion cannot be excluded. No right pleural effusion is seen. Mediastinal and hilar contours are unremarkable. The heart is  mildly enlarged and stable. Left rib detail film show old healed left rib fractures involving the left fifth and sixth ribs. No acute left rib fracture is seen. IMPRESSION: 1. Minimal scarring or atelectasis at the left lung base. Cannot exclude a tiny left pleural effusion. 2. Negative left rib detail for acute fracture. Electronically Signed  By: Dwyane DeePaul  Barry M.D.   On: 07/14/2016 15:07   Assessment & Plan:  82 year old female is here today with chief complaint of left sided chest pain. It appears the patient has a urinary tract infection. We will treat this with Keflex.  We'll will also do a low side effect muscle relaxant at this time. Advised heavy precaution with movement. She will return to clinic as needed. Chest pain, unspecified chest pain type - Plan: POCT CBC, DG Ribs Unilateral W/Chest Left, EKG 12-Lead, POCT urinalysis dipstick, POCT Microscopic Urinalysis ([facility])  Left flank pain - Plan: methocarbamol (ROBAXIN) 500 MG tablet, Urine culture  Urinary tract infection with hematuria, site unspecified - Plan: Basic metabolic panel, cephALEXin (KEFLEX) 500 MG capsule  Trena PlattStephanie English, PA-C [facility] 8/26/20177:57 PM   I personally performed the services described in this documentation, which was scribed in my presence. The recorded information has been reviewed and is accurate.

## 2016-07-14 NOTE — Telephone Encounter (Signed)
Pharm called for clarification on Keflex Rx sent over for pt. It is written for 7 capsules with #14 refills. Pharm asked if Katherine Mcmahon was intending to write for 1 cap BID for 7 days, with quantity of #14 with no refills. Notes were not completed but advised pharm that I am sure that is what Katherine Mcmahon intended. Pharm will fill this way so that pt can get started on treatment, but Katherine Mcmahon, wanted to send this to you to confirm.

## 2016-07-15 NOTE — Telephone Encounter (Signed)
Thank you for the change.  Still getting used to epic.

## 2016-07-16 LAB — URINE CULTURE

## 2016-07-31 ENCOUNTER — Inpatient Hospital Stay (HOSPITAL_COMMUNITY)
Admission: EM | Admit: 2016-07-31 | Discharge: 2016-08-02 | DRG: 281 | Disposition: A | Discharge status: Hospice | Payer: Medicare Other | Attending: Internal Medicine | Admitting: Internal Medicine

## 2016-07-31 ENCOUNTER — Encounter (HOSPITAL_COMMUNITY): Payer: Self-pay | Admitting: *Deleted

## 2016-07-31 ENCOUNTER — Emergency Department (HOSPITAL_COMMUNITY): Payer: Medicare Other

## 2016-07-31 DIAGNOSIS — I6522 Occlusion and stenosis of left carotid artery: Secondary | ICD-10-CM | POA: Diagnosis present

## 2016-07-31 DIAGNOSIS — Z888 Allergy status to other drugs, medicaments and biological substances status: Secondary | ICD-10-CM

## 2016-07-31 DIAGNOSIS — Z66 Do not resuscitate: Secondary | ICD-10-CM | POA: Diagnosis present

## 2016-07-31 DIAGNOSIS — R0600 Dyspnea, unspecified: Secondary | ICD-10-CM | POA: Diagnosis present

## 2016-07-31 DIAGNOSIS — J189 Pneumonia, unspecified organism: Secondary | ICD-10-CM | POA: Diagnosis not present

## 2016-07-31 DIAGNOSIS — I739 Peripheral vascular disease, unspecified: Secondary | ICD-10-CM | POA: Diagnosis present

## 2016-07-31 DIAGNOSIS — I447 Left bundle-branch block, unspecified: Secondary | ICD-10-CM | POA: Diagnosis present

## 2016-07-31 DIAGNOSIS — J90 Pleural effusion, not elsewhere classified: Secondary | ICD-10-CM | POA: Diagnosis present

## 2016-07-31 DIAGNOSIS — H9190 Unspecified hearing loss, unspecified ear: Secondary | ICD-10-CM | POA: Diagnosis present

## 2016-07-31 DIAGNOSIS — R072 Precordial pain: Secondary | ICD-10-CM | POA: Diagnosis not present

## 2016-07-31 DIAGNOSIS — Z515 Encounter for palliative care: Secondary | ICD-10-CM | POA: Diagnosis not present

## 2016-07-31 DIAGNOSIS — I214 Non-ST elevation (NSTEMI) myocardial infarction: Secondary | ICD-10-CM | POA: Diagnosis not present

## 2016-07-31 DIAGNOSIS — J9601 Acute respiratory failure with hypoxia: Secondary | ICD-10-CM

## 2016-07-31 DIAGNOSIS — Z79899 Other long term (current) drug therapy: Secondary | ICD-10-CM

## 2016-07-31 DIAGNOSIS — R079 Chest pain, unspecified: Secondary | ICD-10-CM | POA: Diagnosis present

## 2016-07-31 DIAGNOSIS — Z720 Tobacco use: Secondary | ICD-10-CM | POA: Diagnosis present

## 2016-07-31 DIAGNOSIS — H269 Unspecified cataract: Secondary | ICD-10-CM | POA: Diagnosis present

## 2016-07-31 DIAGNOSIS — I209 Angina pectoris, unspecified: Secondary | ICD-10-CM

## 2016-07-31 DIAGNOSIS — R600 Localized edema: Secondary | ICD-10-CM | POA: Diagnosis present

## 2016-07-31 DIAGNOSIS — F17210 Nicotine dependence, cigarettes, uncomplicated: Secondary | ICD-10-CM | POA: Diagnosis present

## 2016-07-31 DIAGNOSIS — Z8249 Family history of ischemic heart disease and other diseases of the circulatory system: Secondary | ICD-10-CM

## 2016-07-31 DIAGNOSIS — Z7982 Long term (current) use of aspirin: Secondary | ICD-10-CM

## 2016-07-31 DIAGNOSIS — J302 Other seasonal allergic rhinitis: Secondary | ICD-10-CM | POA: Diagnosis present

## 2016-07-31 HISTORY — DX: Unspecified macular degeneration: H35.30

## 2016-07-31 HISTORY — DX: Left bundle-branch block, unspecified: I44.7

## 2016-07-31 HISTORY — DX: Unspecified hearing loss, unspecified ear: H91.90

## 2016-07-31 LAB — CBC
HEMATOCRIT: 39.7 % (ref 36.0–46.0)
Hemoglobin: 12.5 g/dL (ref 12.0–15.0)
MCH: 29.2 pg (ref 26.0–34.0)
MCHC: 31.5 g/dL (ref 30.0–36.0)
MCV: 92.8 fL (ref 78.0–100.0)
PLATELETS: 286 10*3/uL (ref 150–400)
RBC: 4.28 MIL/uL (ref 3.87–5.11)
RDW: 15.1 % (ref 11.5–15.5)
WBC: 8 10*3/uL (ref 4.0–10.5)

## 2016-07-31 LAB — I-STAT CG4 LACTIC ACID, ED: LACTIC ACID, VENOUS: 1.51 mmol/L (ref 0.5–1.9)

## 2016-07-31 LAB — BASIC METABOLIC PANEL
Anion gap: 9 (ref 5–15)
BUN: 25 mg/dL — AB (ref 6–20)
CHLORIDE: 107 mmol/L (ref 101–111)
CO2: 23 mmol/L (ref 22–32)
CREATININE: 1 mg/dL (ref 0.44–1.00)
Calcium: 8.9 mg/dL (ref 8.9–10.3)
GFR calc non Af Amer: 49 mL/min — ABNORMAL LOW (ref >60)
GFR, EST AFRICAN AMERICAN: 57 mL/min — AB (ref >60)
Glucose, Bld: 135 mg/dL — ABNORMAL HIGH (ref 65–99)
POTASSIUM: 3.6 mmol/L (ref 3.5–5.1)
Sodium: 139 mmol/L (ref 135–145)

## 2016-07-31 LAB — BRAIN NATRIURETIC PEPTIDE: B Natriuretic Peptide: 599.7 pg/mL — ABNORMAL HIGH (ref 0.0–100.0)

## 2016-07-31 LAB — I-STAT TROPONIN, ED: Troponin i, poc: 0.05 ng/mL (ref 0.00–0.08)

## 2016-07-31 LAB — STREP PNEUMONIAE URINARY ANTIGEN: Strep Pneumo Urinary Antigen: NEGATIVE

## 2016-07-31 LAB — TROPONIN I: Troponin I: 3.21 ng/mL (ref <0.03)

## 2016-07-31 MED ORDER — NITROGLYCERIN 0.4 MG SL SUBL
0.4000 mg | SUBLINGUAL_TABLET | SUBLINGUAL | Status: DC | PRN
  Administered 2016-07-31 (×3): 0.4 mg via SUBLINGUAL
  Filled 2016-07-31: qty 1

## 2016-07-31 MED ORDER — LORAZEPAM 1 MG PO TABS: 1.0000 mg | ORAL_TABLET | ORAL | Status: DC | PRN

## 2016-07-31 MED ORDER — HALOPERIDOL 0.5 MG PO TABS
0.5000 mg | ORAL_TABLET | ORAL | Status: DC | PRN
  Filled 2016-07-31: qty 1

## 2016-07-31 MED ORDER — HALOPERIDOL LACTATE 5 MG/ML IJ SOLN: 0.5000 mg | INTRAMUSCULAR | Status: DC | PRN

## 2016-07-31 MED ORDER — ASPIRIN 325 MG PO TABS: 325.0000 mg | ORAL_TABLET | Freq: Every day | ORAL | Status: DC

## 2016-07-31 MED ORDER — GLYCOPYRROLATE 1 MG PO TABS
1.0000 mg | ORAL_TABLET | ORAL | Status: DC | PRN
  Filled 2016-07-31: qty 1

## 2016-07-31 MED ORDER — LORAZEPAM 0.5 MG PO TABS
0.5000 mg | ORAL_TABLET | Freq: Two times a day (BID) | ORAL | Status: DC | PRN
  Administered 2016-07-31: 0.5 mg via ORAL
  Filled 2016-07-31: qty 1

## 2016-07-31 MED ORDER — IPRATROPIUM-ALBUTEROL 0.5-2.5 (3) MG/3ML IN SOLN
3.0000 mL | Freq: Four times a day (QID) | RESPIRATORY_TRACT | Status: DC | PRN
  Administered 2016-07-31: 3 mL via RESPIRATORY_TRACT
  Filled 2016-07-31: qty 3

## 2016-07-31 MED ORDER — LORAZEPAM 2 MG/ML IJ SOLN: 1.0000 mg | INTRAMUSCULAR | Status: DC | PRN

## 2016-07-31 MED ORDER — HALOPERIDOL LACTATE 2 MG/ML PO CONC
0.5000 mg | ORAL | Status: DC | PRN
  Administered 2016-07-31: 0.5 mg via SUBLINGUAL
  Filled 2016-07-31 (×2): qty 0.3

## 2016-07-31 MED ORDER — ACETAMINOPHEN 650 MG RE SUPP: 650.0000 mg | Freq: Four times a day (QID) | RECTAL | Status: DC | PRN

## 2016-07-31 MED ORDER — MORPHINE SULFATE (PF) 2 MG/ML IV SOLN
2.0000 mg | Freq: Once | INTRAVENOUS | Status: AC
  Administered 2016-07-31: 2 mg via INTRAVENOUS
  Filled 2016-07-31: qty 1

## 2016-07-31 MED ORDER — POLYVINYL ALCOHOL 1.4 % OP SOLN
1.0000 [drp] | Freq: Four times a day (QID) | OPHTHALMIC | Status: DC | PRN
  Filled 2016-07-31: qty 15

## 2016-07-31 MED ORDER — GLYCOPYRROLATE 0.2 MG/ML IJ SOLN
0.2000 mg | INTRAMUSCULAR | Status: DC | PRN
  Administered 2016-08-01 – 2016-08-02 (×5): 0.2 mg via INTRAVENOUS
  Filled 2016-07-31 (×7): qty 1

## 2016-07-31 MED ORDER — AZITHROMYCIN 500 MG IV SOLR
500.0000 mg | INTRAVENOUS | Status: DC
Start: 2016-07-31 — End: 2016-08-01
  Administered 2016-07-31 – 2016-08-01 (×2): 500 mg via INTRAVENOUS
  Filled 2016-07-31 (×3): qty 500

## 2016-07-31 MED ORDER — MORPHINE SULFATE (PF) 2 MG/ML IV SOLN
1.0000 mg | INTRAVENOUS | Status: DC | PRN
  Administered 2016-07-31: 1 mg via INTRAVENOUS
  Filled 2016-07-31: qty 1

## 2016-07-31 MED ORDER — MORPHINE SULFATE (PF) 2 MG/ML IV SOLN
1.0000 mg | INTRAVENOUS | Status: DC | PRN
  Administered 2016-07-31 – 2016-08-01 (×2): 1 mg via INTRAVENOUS
  Filled 2016-07-31 (×2): qty 1

## 2016-07-31 MED ORDER — ONDANSETRON HCL 4 MG/2ML IJ SOLN: 4.0000 mg | Freq: Four times a day (QID) | INTRAMUSCULAR | Status: DC | PRN

## 2016-07-31 MED ORDER — GLYCOPYRROLATE 0.2 MG/ML IJ SOLN
0.2000 mg | INTRAMUSCULAR | Status: DC | PRN
  Filled 2016-07-31: qty 1

## 2016-07-31 MED ORDER — LORAZEPAM 2 MG/ML PO CONC
1.0000 mg | ORAL | Status: DC | PRN
  Administered 2016-08-01 – 2016-08-02 (×5): 1 mg via SUBLINGUAL
  Filled 2016-07-31 (×5): qty 1

## 2016-07-31 MED ORDER — HEPARIN SODIUM (PORCINE) 5000 UNIT/ML IJ SOLN: 5000.0000 [IU] | Freq: Three times a day (TID) | INTRAMUSCULAR | Status: DC

## 2016-07-31 MED ORDER — ONDANSETRON 4 MG PO TBDP: 4.0000 mg | ORAL_TABLET | Freq: Four times a day (QID) | ORAL | Status: DC | PRN

## 2016-07-31 MED ORDER — DEXTROSE 5 % IV SOLN
1.0000 g | INTRAVENOUS | Status: DC
  Administered 2016-07-31 – 2016-08-01 (×2): 1 g via INTRAVENOUS
  Filled 2016-07-31 (×2): qty 10

## 2016-07-31 MED ORDER — SODIUM CHLORIDE 0.9 % IV SOLN: INTRAVENOUS | Status: DC

## 2016-07-31 MED ORDER — ACETAMINOPHEN 325 MG PO TABS: 650.0000 mg | ORAL_TABLET | Freq: Four times a day (QID) | ORAL | Status: DC | PRN

## 2016-07-31 MED ORDER — BIOTENE DRY MOUTH MT LIQD: 15.0000 mL | OROMUCOSAL | Status: DC | PRN

## 2016-07-31 NOTE — ED Provider Notes (Signed)
[facility]-EMERGENCY DEPT Provider Note   CSN: [id number] Arrival date & time: 07/31/16  1303     History   Chief Complaint Chief Complaint  Patient presents with  . Chest Pain    HPI Katherine Mcmahon is a 82 y.o. female.  82 year old female presents with substernal chest discomfort that began approximately one hour ago. Patient states that the pain is on the left and right side of her chest worse with movement or taking deep breath. No prior history of same. Take aspirin without relief. Denies any recent leg pain or swelling. Has had some cough and mild congestion. No nausea vomiting or diarrhea. Denies any recent illnesses. Transported by EMS      Past Medical History:  Diagnosis Date  . Cataract   . HOH (hard of hearing)   . Osteoarthritis    SHOULDER/HANDS DR GROAT  . PVD (peripheral vascular disease) (HCC)    LEFT INTERNAL CAROTID ARTERY  STENOSIS   . Seasonal allergies     Patient Active Problem List   Diagnosis Date Noted  . Right rib fracture 07/05/2014  . Fall 07/05/2014  . Laceration of arm, right, complicated 03/21/2013    No past surgical history on file.  OB History    No data available       Home Medications    Prior to Admission medications   Medication Sig Start Date End Date Taking? Authorizing Provider  aspirin 325 MG tablet Take 325 mg by mouth daily.    Historical Provider, MD  cephALEXin (KEFLEX) 500 MG capsule Take 1 capsule (500 mg total) by mouth 2 (two) times daily. 07/14/16   Collie SiadStephanie D English, PA  ibuprofen (ADVIL,MOTRIN) 200 MG tablet Take 800 mg by mouth every 6 (six) hours as needed (for pain).    Historical Provider, MD  Influenza vac split quadrivalent PF (FLUARIX) 0.5 ML injection Inject 0.5 mLs into the muscle once.    Historical Provider, MD  methocarbamol (ROBAXIN) 500 MG tablet Take 1 tablet (500 mg total) by mouth every 12 (twelve) hours. 07/14/16   Garnetta BuddyStephanie D English, PA  Multiple Vitamin (MULTIVITAMIN WITH MINERALS) TABS  tablet Take 1 tablet by mouth daily.    Historical Provider, MD  Tdap (BOOSTRIX) 5-2.5-18.5 LF-MCG/0.5 injection Inject 0.5 mLs into the muscle once.    Historical Provider, MD    Family History Family History  Problem Relation Age of Onset  . Cancer Mother     colon  . Heart attack Father   . Cancer Sister     bone and breast    Social History Social History  Substance Use Topics  . Smoking status: Current Every Day Smoker    Packs/day: 0.75    Years: 66.00    Types: Cigarettes  . Smokeless tobacco: Never Used  . Alcohol use No     Allergies   Review of patient's allergies indicates no known allergies.   Review of Systems Review of Systems  All other systems reviewed and are negative.    Physical Exam Updated Vital Signs BP 105/94   Pulse 96   Temp 98.1 F (36.7 C) (Oral)   Resp 13   Ht 5\' 5"  (1.651 m)   Wt 59 kg   SpO2 94%   BMI 21.63 kg/m   Physical Exam  Constitutional: She is oriented to person, place, and time. She appears well-developed and well-nourished.  Non-toxic appearance. No distress.  HENT:  Head: Normocephalic and atraumatic.  Eyes: Conjunctivae, EOM and lids are normal.  Pupils are equal, round, and reactive to light.  Neck: Normal range of motion. Neck supple. No tracheal deviation present. No thyroid mass present.  Cardiovascular: Normal rate, regular rhythm and normal heart sounds.  Exam reveals no gallop.   No murmur heard. Pulmonary/Chest: Effort normal. No stridor. No respiratory distress. She has decreased breath sounds in the left lower field. She has rhonchi in the left lower field. She has no rales.  Abdominal: Soft. Normal appearance and bowel sounds are normal. She exhibits no distension. There is no tenderness. There is no rebound and no CVA tenderness.  Musculoskeletal: Normal range of motion. She exhibits no edema or tenderness.  Neurological: She is alert and oriented to person, place, and time. She has normal strength. No  cranial nerve deficit or sensory deficit. GCS eye subscore is 4. GCS verbal subscore is 5. GCS motor subscore is 6.  Skin: Skin is warm and dry. No abrasion and no rash noted.  Psychiatric: She has a normal mood and affect. Her speech is normal and behavior is normal.  Nursing note and vitals reviewed.    ED Treatments / Results  Labs (all labs ordered are listed, but only abnormal results are displayed) Labs Reviewed  CULTURE, BLOOD (ROUTINE X 2)  CULTURE, BLOOD (ROUTINE X 2)  CBC  BASIC METABOLIC PANEL  BRAIN NATRIURETIC PEPTIDE  I-STAT TROPOININ, ED  I-STAT CG4 LACTIC ACID, ED    EKG  EKG Interpretation  Date/Time:  Saturday July 31 2016 13:10:35 EDT Ventricular Rate:  91 PR Interval:    QRS Duration: 167 QT Interval:  391 QTC Calculation: 482 R Axis:   -65 Text Interpretation:  Sinus rhythm Paired ventricular premature complexes Left bundle branch block No significant change since last tracing Confirmed by Asiah Befort  MD, Emaan Gary ([id number]) on 07/31/2016 1:49:23 PM       Radiology Dg Chest 2 View  Result Date: 07/31/2016 CLINICAL DATA:  Patient reports mid chest pain that started this morning, wheezing sounds due to smoking. HX smoker, cracked ribs EXAM: CHEST  2 VIEW COMPARISON:  07/14/2016 FINDINGS: Heart margins are obscured by opacity in the left lower lobe. There is parenchymal opacity as well as pleural effusion. Small right pleural effusions also present. Perihilar bronchitic changes are again noted. No pulmonary edema. IMPRESSION: 1. Left lower lobe infiltrate. 2. Bilateral pleural effusions. Electronically Signed   By: Norva Pavlov M.D.   On: 07/31/2016 13:35    Procedures Procedures (including critical care time)  Medications Ordered in ED Medications  azithromycin (ZITHROMAX) 500 mg in dextrose 5 % 250 mL IVPB (not administered)  cefTRIAXone (ROCEPHIN) 1 g in dextrose 5 % 50 mL IVPB (not administered)     Initial Impression / Assessment and Plan / ED  Course  I have reviewed the triage vital signs and the nursing notes.  Pertinent labs & imaging results that were available during my care of the patient were reviewed by me and considered in my medical decision making (see chart for details).  Clinical Course    Patient's chest x-ray consistent with pneumonia. She has had increased cough recently. Does continue to smoke daily. Started on IV antibiotics and will be admitted to the hospital  Final Clinical Impressions(s) / ED Diagnoses   Final diagnoses:  None    New Prescriptions New Prescriptions   No medications on file     Lorre Nick, MD 07/31/16 1422

## 2016-07-31 NOTE — Progress Notes (Signed)
Called by nursing staff/RRT regarding patient.  Patient without significant PMH (despite long-standing tobacco dependence that is ongoing) who was admitted for CAP earlier tonight.  Her troponin has now increased to >3, indicating NSTEMI.  I came to see the patient and talked at length with the patient's family.  She is in moderate respiratory distress and has generalized chest discomfort.  EKG without marked changes.  MOST form reviewed, both daughters present.  At this time, both daughters are in agreement to transition to full comfort care.  Will implement comfort care order set and end aggressive treatment strategies at this time.  Per her MOST form, will continue antibiotics and provide minimal IVF.  Will initially treat with Ativan and morphine injections but can transition to morphine drip if this is insufficient for controlling patient's pain, anxiety.  Katherine CurioJennifer E. Danya Mcmahon, M.D.

## 2016-07-31 NOTE — Progress Notes (Signed)
Attempted to get report from ED. RN busy, will call back.

## 2016-07-31 NOTE — H&P (Signed)
History and Physical    Katherine ShoresJoyce W Mceachin ZOX:[id number] DOB: 15-Jul-1927 DOA: 07/31/2016  PCP: Ginette OttoSTONEKING,HAL THOMAS, MD  Patient coming from: home  Chief Complaint: chest pain  HPI: Katherine Mcmahon is a 82 y.o. female with medical history significant of left bundle branch block and tobacco abuse. She states that this morning at 11 AM, she was sitting in her chair and started having anterior chest pain. She describes it as a "soreness" without radiation, 6-8 out of 10, without any associated symptoms including no dizziness or shortness of breath. She states that she has never had this chest pain in the past.  Previously, she had been relatively healthy. She is independent and lives at home alone. She states that she has a cough but this is chronic in nature. The cough is not any worse today than it had been previously. She denies any fevers, chills, shortness of breath, nausea, vomiting, diarrhea, abdominal pain. She was recently prescribed Lasix for peripheral edema.   ED Course: CXR showed LLL pneumonia. EKG completed and labs drawn. Patient started on rocephin and zithromax.   Review of Systems: As per HPI otherwise 10 point review of systems negative.   Past Medical History:  Diagnosis Date  . Cataract   . HOH (hard of hearing)   . LBBB (left bundle branch block)   . Osteoarthritis    SHOULDER/HANDS DR GROAT  . PVD (peripheral vascular disease) (HCC)    LEFT INTERNAL CAROTID ARTERY  STENOSIS   . Seasonal allergies     Past Surgical History:  Procedure Laterality Date  . NO PAST SURGERIES      reports that she has been smoking Cigarettes.  She has a 66.00 pack-year smoking history. She has never used smokeless tobacco. She reports that she does not drink alcohol or use drugs.  Allergies  Allergen Reactions  . Tramadol Hcl Other (See Comments)    Mental status changes    Family History  Problem Relation Age of Onset  . Cancer Mother     colon  . Heart attack Father   .  Cancer Sister     bone and breast    Prior to Admission medications   Medication Sig Start Date End Date Taking? Authorizing Provider  aspirin 325 MG tablet Take 325 mg by mouth daily.    Historical Provider, MD  cephALEXin (KEFLEX) 500 MG capsule Take 1 capsule (500 mg total) by mouth 2 (two) times daily. 07/14/16   Collie SiadStephanie D English, PA  ibuprofen (ADVIL,MOTRIN) 200 MG tablet Take 800 mg by mouth every 6 (six) hours as needed (for pain).    Historical Provider, MD  Influenza vac split quadrivalent PF (FLUARIX) 0.5 ML injection Inject 0.5 mLs into the muscle once.    Historical Provider, MD  methocarbamol (ROBAXIN) 500 MG tablet Take 1 tablet (500 mg total) by mouth every 12 (twelve) hours. 07/14/16   Garnetta BuddyStephanie D English, PA  Multiple Vitamin (MULTIVITAMIN WITH MINERALS) TABS tablet Take 1 tablet by mouth daily.    Historical Provider, MD  Tdap (BOOSTRIX) 5-2.5-18.5 LF-MCG/0.5 injection Inject 0.5 mLs into the muscle once.    Historical Provider, MD    Physical Exam: Vitals:   07/31/16 1345 07/31/16 1346 07/31/16 1400 07/31/16 1415  BP:  105/94 128/84 134/78  Pulse: 89 96 89 90  Resp: 17 13 14 17   Temp:      TempSrc:      SpO2: 96% 94% 95% 94%  Weight:      Height:  Constitutional: NAD, calm, comfortable Vitals:   07/31/16 1345 07/31/16 1346 07/31/16 1400 07/31/16 1415  BP:  105/94 128/84 134/78  Pulse: 89 96 89 90  Resp: 17 13 14 17   Temp:      TempSrc:      SpO2: 96% 94% 95% 94%  Weight:      Height:       Eyes: PERRL, lids and conjunctivae normal ENMT: Mucous membranes are moist. Posterior pharynx clear of any exudate or lesions.Normal dentition.  Neck: normal, supple, no masses, no thyromegaly Respiratory: anterior wheezing, +crackles left lower base. Normal respiratory effort. No accessory muscle use.  Cardiovascular: Regular rate and rhythm, no murmurs / rubs / gallops. +1 peripheral extremity edema. 2+ pedal pulses. Anterior left and right chest pain,  reproducible to palpation.  Abdomen: no tenderness, no masses palpated. No hepatosplenomegaly. Bowel sounds positive.  Musculoskeletal: no clubbing / cyanosis. No joint deformity upper and lower extremities. Good ROM, no contractures. Normal muscle tone.  Skin: no rashes, lesions, ulcers. No induration Neurologic: CN 2-12 grossly intact. Sensation intact, DTR normal. Strength 5/5 in all 4.  Psychiatric: Normal judgment and insight. Alert and oriented x 3. Normal mood.   Labs on Admission: I have personally reviewed following labs and imaging studies  CBC:  Recent Labs Lab 07/31/16 1311  WBC 8.0  HGB 12.5  HCT 39.7  MCV 92.8  PLT 286   Basic Metabolic Panel:  Recent Labs Lab 07/31/16 1311  NA 139  K 3.6  CL 107  CO2 23  GLUCOSE 135*  BUN 25*  CREATININE 1.00  CALCIUM 8.9   GFR: Estimated Creatinine Clearance: 35.7 mL/min (by C-G formula based on SCr of 1 mg/dL). Liver Function Tests: No results for input(s): AST, ALT, ALKPHOS, BILITOT, PROT, ALBUMIN in the last 168 hours. No results for input(s): LIPASE, AMYLASE in the last 168 hours. No results for input(s): AMMONIA in the last 168 hours. Coagulation Profile: No results for input(s): INR, PROTIME in the last 168 hours. Cardiac Enzymes: No results for input(s): CKTOTAL, CKMB, CKMBINDEX, TROPONINI in the last 168 hours. BNP (last 3 results) No results for input(s): PROBNP in the last 8760 hours. HbA1C: No results for input(s): HGBA1C in the last 72 hours. CBG: No results for input(s): GLUCAP in the last 168 hours. Lipid Profile: No results for input(s): CHOL, HDL, LDLCALC, TRIG, CHOLHDL, LDLDIRECT in the last 72 hours. Thyroid Function Tests: No results for input(s): TSH, T4TOTAL, FREET4, T3FREE, THYROIDAB in the last 72 hours. Anemia Panel: No results for input(s): VITAMINB12, FOLATE, FERRITIN, TIBC, IRON, RETICCTPCT in the last 72 hours. Urine analysis:    Component Value Date/Time   COLORURINE YELLOW  07/08/2014 1745   APPEARANCEUR CLEAR 07/08/2014 1745   LABSPEC 1.017 07/08/2014 1745   PHURINE 5.5 07/08/2014 1745   GLUCOSEU NEGATIVE 07/08/2014 1745   HGBUR TRACE (A) 07/08/2014 1745   BILIRUBINUR negative 07/14/2016 1530   KETONESUR trace (5) (A) 07/14/2016 1530   KETONESUR NEGATIVE 07/08/2014 1745   PROTEINUR =30 (A) 07/14/2016 1530   PROTEINUR NEGATIVE 07/08/2014 1745   UROBILINOGEN 2.0 07/14/2016 1530   UROBILINOGEN 1.0 07/08/2014 1745   NITRITE Positive (A) 07/14/2016 1530   NITRITE POSITIVE (A) 07/08/2014 1745   LEUKOCYTESUR Trace (A) 07/14/2016 1530   No results found for this or any previous visit (from the past 240 hour(s)).   Radiological Exams on Admission: Dg Chest 2 View  Result Date: 07/31/2016 CLINICAL DATA:  Patient reports mid chest pain that started this morning,  wheezing sounds due to smoking. HX smoker, cracked ribs EXAM: CHEST  2 VIEW COMPARISON:  07/14/2016 FINDINGS: Heart margins are obscured by opacity in the left lower lobe. There is parenchymal opacity as well as pleural effusion. Small right pleural effusions also present. Perihilar bronchitic changes are again noted. No pulmonary edema. IMPRESSION: 1. Left lower lobe infiltrate. 2. Bilateral pleural effusions. Electronically Signed   By: Norva Pavlov M.D.   On: 07/31/2016 13:35   EKG: Independently reviewed. NSR with PVC, LAD, LBBB, similar to previous.   Assessment/Plan Principal Problem:   CAP (community acquired pneumonia) Active Problems:   Chest pain   Tobacco abuse  LLL CAP  Rocephin, zithromax  Strep pneumo Ag ordered   Monitor VS  Daily CBC  Sputum cx if able   Chest pain  R/o cardiac etiology, likely MSK   Troponin q6h x 3   Asa daily   Tobacco abuse   Cessation counseling    DVT prophylaxis: subq heparin  Code Status: DNR Family Communication: oldest daughter Rosey Bath in room during examination  Disposition Plan: pending work up  [employer] called: None Admission status:  observation, tele   Jordan Hawks, DO [facility] Pager [phone]  If 7PM-7AM, please contact night-coverage [email or web address] Password [email or web address] 07/31/2016, 3:21 PM

## 2016-07-31 NOTE — ED Triage Notes (Signed)
Pt in from home via [facility], pt c/o non radiating substernal CP onset today x 1hr ago, pt rcvd 324 mg ASA pta, pt c/o SOB, pt denies n/v/d, pt rcvd x 4 sL nitro pta with pain decreasing from 8/10 to 6/10, A&o x4

## 2016-07-31 NOTE — ED Notes (Addendum)
Family made aware of bed pts assignment

## 2016-08-01 DIAGNOSIS — H9190 Unspecified hearing loss, unspecified ear: Secondary | ICD-10-CM | POA: Diagnosis present

## 2016-08-01 DIAGNOSIS — I6522 Occlusion and stenosis of left carotid artery: Secondary | ICD-10-CM | POA: Diagnosis present

## 2016-08-01 DIAGNOSIS — Z888 Allergy status to other drugs, medicaments and biological substances status: Secondary | ICD-10-CM | POA: Diagnosis not present

## 2016-08-01 DIAGNOSIS — Z66 Do not resuscitate: Secondary | ICD-10-CM | POA: Diagnosis present

## 2016-08-01 DIAGNOSIS — H269 Unspecified cataract: Secondary | ICD-10-CM | POA: Diagnosis present

## 2016-08-01 DIAGNOSIS — Z720 Tobacco use: Secondary | ICD-10-CM

## 2016-08-01 DIAGNOSIS — I214 Non-ST elevation (NSTEMI) myocardial infarction: Secondary | ICD-10-CM | POA: Diagnosis present

## 2016-08-01 DIAGNOSIS — Z7982 Long term (current) use of aspirin: Secondary | ICD-10-CM | POA: Diagnosis not present

## 2016-08-01 DIAGNOSIS — I447 Left bundle-branch block, unspecified: Secondary | ICD-10-CM | POA: Diagnosis present

## 2016-08-01 DIAGNOSIS — Z8249 Family history of ischemic heart disease and other diseases of the circulatory system: Secondary | ICD-10-CM | POA: Diagnosis not present

## 2016-08-01 DIAGNOSIS — J189 Pneumonia, unspecified organism: Secondary | ICD-10-CM | POA: Diagnosis present

## 2016-08-01 DIAGNOSIS — F17200 Nicotine dependence, unspecified, uncomplicated: Secondary | ICD-10-CM

## 2016-08-01 DIAGNOSIS — F17210 Nicotine dependence, cigarettes, uncomplicated: Secondary | ICD-10-CM | POA: Diagnosis present

## 2016-08-01 DIAGNOSIS — R600 Localized edema: Secondary | ICD-10-CM | POA: Diagnosis present

## 2016-08-01 DIAGNOSIS — J302 Other seasonal allergic rhinitis: Secondary | ICD-10-CM | POA: Diagnosis present

## 2016-08-01 DIAGNOSIS — Z79899 Other long term (current) drug therapy: Secondary | ICD-10-CM | POA: Diagnosis not present

## 2016-08-01 DIAGNOSIS — J9601 Acute respiratory failure with hypoxia: Secondary | ICD-10-CM

## 2016-08-01 DIAGNOSIS — I209 Angina pectoris, unspecified: Secondary | ICD-10-CM | POA: Diagnosis not present

## 2016-08-01 DIAGNOSIS — J9600 Acute respiratory failure, unspecified whether with hypoxia or hypercapnia: Secondary | ICD-10-CM

## 2016-08-01 DIAGNOSIS — Z515 Encounter for palliative care: Secondary | ICD-10-CM | POA: Diagnosis not present

## 2016-08-01 DIAGNOSIS — J90 Pleural effusion, not elsewhere classified: Secondary | ICD-10-CM | POA: Diagnosis present

## 2016-08-01 DIAGNOSIS — R0600 Dyspnea, unspecified: Secondary | ICD-10-CM | POA: Diagnosis present

## 2016-08-01 DIAGNOSIS — I739 Peripheral vascular disease, unspecified: Secondary | ICD-10-CM | POA: Diagnosis present

## 2016-08-01 MED ORDER — LORAZEPAM 2 MG/ML PO CONC: 1.0000 mg | ORAL | 0 refills | Status: AC | PRN

## 2016-08-01 MED ORDER — GLYCOPYRROLATE 0.2 MG/ML IJ SOLN: 0.2000 mg | INTRAMUSCULAR | 0 refills | Status: AC | PRN

## 2016-08-01 MED ORDER — MORPHINE SULFATE (PF) 2 MG/ML IV SOLN
2.0000 mg | INTRAVENOUS | Status: DC | PRN
  Administered 2016-08-01 – 2016-08-02 (×5): 2 mg via INTRAVENOUS
  Filled 2016-08-01 (×5): qty 1

## 2016-08-01 MED ORDER — MORPHINE SULFATE (CONCENTRATE) 10 MG /0.5 ML PO SOLN: 4.0000 mg | ORAL | 0 refills | Status: AC | PRN

## 2016-08-01 NOTE — Discharge Summary (Signed)
Physician Discharge Summary  Katherine Mcmahon ZOX:[id number] DOB: Apr 07, 1927 DOA: 07/31/2016  PCP: Ginette Otto, MD  Admit date: 07/31/2016 Discharge date: 08/02/2016 Admitted From: Home Disposition:  Residential Hospice     Home Health:NO Equipment/Devices:None Discharge Condition:Stable  CODE STATUS: Comfort Care Diet recommendation: comfort feeding   Brief/Interim Summary: 82 year old female with history of peripheral vascular disease, carotid stenosis, left bundle-branch block presented with chest pain and shortness of breath. The patient's daughters at the bedside to supplemental history. Apparently, she found the patient at home clutching her chest with shortness of breath. EMS was activated. The patient was given nitroglycerin with some improvement. Evaluation in the emergency department revealed left bundle branch block which was unchanged with essentially unremarkable BMP and CBC.  Initial chest x-ray showed left lower lobe opacity and bilateral pleural effusions. There was initial concern for pneumonia, and the patient was started on ceftriaxone and azithromycin.  Later in the day, the patient's troponin was noted to be elevated at 3.21.  The patient was also noted to have some respiratory distress. After discussion with the patient's family, they did not want any further heroic measures and wanted to transition the patient's care to focus on full comfort. The patient was started on intravenous morphine and Ativan for symptomatic management. After discussion with the patient's family, they are agreeable to transfer to residential hospice if the patient qualifies.   Social work was consulted to assist with transfer to residential hospice.  Discharge Diagnoses:  NSTEMI -After discussion with the patient's family, they did not want further heroic measures and wanted to change the patient's focus of care to that a full comfort -Defer further aggressive interventions  -continue IV  morphine and Ativan for symptom management -EKG --left bundle branch block   Pulmonary opacities -Not convinced that the patient has pneumonia clinically with normal WBC and no fever  -suspect the patient's pulmonary opacities likely represent pleural effusions -as the pt's focus of care is now changed to that to focus on full comfort, d/c antibiotics with which the patient's family was agreeable -This was discussed with the patient's family who understand and agree   -defer any further lab work or radiographic studies   Tobacco Abuse -not active issue as pt's focus of care has been transitioned to that focused on full comfort  Goals of Care -FULL COMFORT - the patient is clinically stable but continues to require morphine and Ativan for continued symptom management. The patient has high risk of decompensation and death within the next 2 weeks requiring continued symptom management  -Social worker contacted for possible transition to [employer]     Medication List    STOP taking these medications   aspirin 325 MG tablet   CALCIUM + D PO   furosemide 20 MG tablet Commonly known as:  LASIX   ibuprofen 200 MG tablet Commonly known as:  ADVIL,MOTRIN   PRESERVISION AREDS 2+MULTI VIT PO     TAKE these medications   glycopyrrolate 0.2 MG/ML injection Commonly known as:  ROBINUL Inject 1 mL (0.2 mg total) into the skin every 4 (four) hours as needed (excessive secretions).   LORazepam 2 MG/ML concentrated solution Commonly known as:  ATIVAN Place 0.5 mLs (1 mg total) under the tongue every 4 (four) hours as needed for anxiety.   morphine CONCENTRATE 10 mg / 0.5 ml concentrated solution Take 0.2 mLs (4 mg total) by mouth every 2 (two) hours as needed for severe pain or shortness  of breath.       Allergies  Allergen Reactions  . Tramadol Hcl Other (See Comments)    Mental status changes     Consultations:  none   Procedures/Studies: Dg Chest 2 View  Result Date: 07/31/2016 CLINICAL DATA:  Patient reports mid chest pain that started this morning, wheezing sounds due to smoking. HX smoker, cracked ribs EXAM: CHEST  2 VIEW COMPARISON:  07/14/2016 FINDINGS: Heart margins are obscured by opacity in the left lower lobe. There is parenchymal opacity as well as pleural effusion. Small right pleural effusions also present. Perihilar bronchitic changes are again noted. No pulmonary edema. IMPRESSION: 1. Left lower lobe infiltrate. 2. Bilateral pleural effusions. Electronically Signed   By: Norva PavlovElizabeth  Brown M.D.   On: 07/31/2016 13:35   Dg Ribs Unilateral W/chest Left  Result Date: 07/14/2016 CLINICAL DATA:  Left-sided chest pain EXAM: LEFT RIBS AND CHEST - 3+ VIEW COMPARISON:  Chest x-ray of 07/08/2014 FINDINGS: No definite pneumonia is seen. There is minimal atelectasis or scarring at the left lung base, and a tiny left pleural effusion cannot be excluded. No right pleural effusion is seen. Mediastinal and hilar contours are unremarkable. The heart is mildly enlarged and stable. Left rib detail film show old healed left rib fractures involving the left fifth and sixth ribs. No acute left rib fracture is seen. IMPRESSION: 1. Minimal scarring or atelectasis at the left lung base. Cannot exclude a tiny left pleural effusion. 2. Negative left rib detail for acute fracture. Electronically Signed   By: Dwyane DeePaul  Barry M.D.   On: 07/14/2016 15:07        Discharge Exam: Vitals:   08/01/16 0958 08/02/16 0520  BP: 120/81 (!) 153/44  Pulse: 97 84  Resp: 18 18  Temp: 98.9 F (37.2 C)    Vitals:   08/01/16 0447 08/01/16 0958 08/01/16 1558 08/02/16 0520  BP: 112/83 120/81  (!) 153/44  Pulse: 100 97  84  Resp: 18 18  18   Temp: 98 F (36.7 C) 98.9 F (37.2 C)    TempSrc: Oral Oral    SpO2: 97% 97% 100% 98%  Weight:      Height:        General: Pt is alert, awake, not in acute  distress Cardiovascular: RRR, S1/S2 +, no rubs, no gallops Respiratory: CTA bilaterally, no wheezing, no rhonchi Abdominal: Soft, NT, ND, bowel sounds + Extremities: no edema, no cyanosis   The results of significant diagnostics from this hospitalization (including imaging, microbiology, ancillary and laboratory) are listed below for reference.    Significant Diagnostic Studies: Dg Chest 2 View  Result Date: 07/31/2016 CLINICAL DATA:  Patient reports mid chest pain that started this morning, wheezing sounds due to smoking. HX smoker, cracked ribs EXAM: CHEST  2 VIEW COMPARISON:  07/14/2016 FINDINGS: Heart margins are obscured by opacity in the left lower lobe. There is parenchymal opacity as well as pleural effusion. Small right pleural effusions also present. Perihilar bronchitic changes are again noted. No pulmonary edema. IMPRESSION: 1. Left lower lobe infiltrate. 2. Bilateral pleural effusions. Electronically Signed   By: Norva PavlovElizabeth  Brown M.D.   On: 07/31/2016 13:35   Dg Ribs Unilateral W/chest Left  Result Date: 07/14/2016 CLINICAL DATA:  Left-sided chest pain EXAM: LEFT RIBS AND CHEST - 3+ VIEW COMPARISON:  Chest x-ray of 07/08/2014 FINDINGS: No definite pneumonia is seen. There is minimal atelectasis or scarring at the left lung base, and a tiny left pleural effusion cannot be excluded. No right pleural  effusion is seen. Mediastinal and hilar contours are unremarkable. The heart is mildly enlarged and stable. Left rib detail film show old healed left rib fractures involving the left fifth and sixth ribs. No acute left rib fracture is seen. IMPRESSION: 1. Minimal scarring or atelectasis at the left lung base. Cannot exclude a tiny left pleural effusion. 2. Negative left rib detail for acute fracture. Electronically Signed   By: Dwyane Dee M.D.   On: 07/14/2016 15:07     Microbiology: Recent Results (from the past 240 hour(s))  Culture, blood (Routine X 2) w Reflex to ID Panel     Status:  None (Preliminary result)   Collection Time: 07/31/16  2:04 PM  Result Value Ref Range Status   Specimen Description BLOOD RIGHT THUMB  Final   Special Requests IN PEDIATRIC BOTTLE  2CC  Final   Culture NO GROWTH 1 DAY  Final   Report Status PENDING  Incomplete  Culture, blood (Routine X 2) w Reflex to ID Panel     Status: None (Preliminary result)   Collection Time: 07/31/16  5:51 PM  Result Value Ref Range Status   Specimen Description LEFT ANTECUBITAL  Final   Special Requests BOTTLES DRAWN AEROBIC AND ANAEROBIC 5CC  Final   Culture NO GROWTH < 24 HOURS  Final   Report Status PENDING  Incomplete     Labs: Basic Metabolic Panel:  Recent Labs Lab 07/31/16 1311  NA 139  K 3.6  CL 107  CO2 23  GLUCOSE 135*  BUN 25*  CREATININE 1.00  CALCIUM 8.9   Liver Function Tests: No results for input(s): AST, ALT, ALKPHOS, BILITOT, PROT, ALBUMIN in the last 168 hours. No results for input(s): LIPASE, AMYLASE in the last 168 hours. No results for input(s): AMMONIA in the last 168 hours. CBC:  Recent Labs Lab 07/31/16 1311  WBC 8.0  HGB 12.5  HCT 39.7  MCV 92.8  PLT 286   Cardiac Enzymes:  Recent Labs Lab 07/31/16 1744  TROPONINI 3.21*   BNP: Invalid input(s): POCBNP CBG: No results for input(s): GLUCAP in the last 168 hours.  Time coordinating discharge:  Greater than 30 minutes  Signed:  Steed Kanaan, DO [facility] Pager: [phone] 08/02/2016, 10:32 AM

## 2016-08-01 NOTE — Progress Notes (Signed)
PROGRESS NOTE  Katherine ShoresJoyce W Mcmahon ZOX:[id number] DOB: 1927-09-07 DOA: 07/31/2016 PCP: Ginette OttoSTONEKING,HAL THOMAS, MD  Brief History:  82 year old female with history of peripheral vascular disease, carotid stenosis, left bundle-branch block presented with chest pain and shortness of breath. The patient's daughters at the bedside to supplemental history. Apparently, she found the patient at home clutching her chest with shortness of breath. EMS was activated. The patient was given nitroglycerin with some improvement. Evaluation in the emergency department revealed left bundle branch block which was unchanged with essentially unremarkable BMP and CBC.  Initial chest x-ray showed left lower lobe opacity and bilateral pleural effusions. There was initial concern for pneumonia, and the patient was started on ceftriaxone and azithromycin.  Later in the day, the patient's troponin was noted to be elevated at 3.21.  The patient was also noted to have some respiratory distress. After discussion with the patient's family, they did not want any further heroic measures and wanted to transition the patient's care to focus on full comfort. The patient was started on intravenous morphine and Ativan for symptomatic management. After discussion with the patient's family, they are agreeable to transfer to residential hospice if the patient qualifies.   Social work was consulted to assist with transfer to residential hospice.  Assessment/Plan: NSTEMI -After discussion with the patient's family, they did not want further heroic measures and wanted to change the patient's focus of care to that a full comfort -Defer further aggressive interventions  -continue IV morphine and Ativan for symptom management -EKG --left bundle branch block   Pulmonary opacities -Not convinced that the patient has pneumonia clinically with normal WBC and no fever  -suspect the patient's pulmonary opacities likely represent pleural  effusions -as the pt's focus of care is now changed to that to focus on full comfort, will continue abx for now as a means for symptom management but plan to discontinue antibiotics after transition to residential hospice  -This was discussed with the patient's family who understand and agree  -defer any further lab work or radiographic studies   Tobacco Abuse -not active issue as pt's focus of care has been transitioned to that focused on full comfort  Goals of Care -FULL COMFORT - the patient is clinically stable but continues to require morphine and Ativan for continued symptom management. The patient has high risk of decompensation and death within the next 2 weeks requiring continued symptom management  -Social worker contacted for possible transition [facility]    Disposition Plan:   Residential Hospice  Family Communication:   Daughter updated at bedside--Total time spent 40 minutes.  Greater than 50% spent face to face counseling and coordinating care.   Consultants:  none    Code Status:  FULL COMFORT  DVT Prophylaxis:  FULL COMFORT   Procedures: As Listed in Progress Note Above  Antibiotics: Ceftriaxone 07/31/2016>>> Azithromycin 07/31/2016>>>    Subjective:  the patient is comfortable presently without any chest discomfort, shortness breath, nausea, vomiting, abdominal pain, headache. No distress. Secretions under control.   Objective: Vitals:   07/31/16 1607 07/31/16 1833 08/01/16 0041 08/01/16 0447  BP:  123/90  112/83  Pulse:  (!) 102 93 100  Resp:  20  18  Temp: 97.6 F (36.4 C) 97.4 F (36.3 C)  98 F (36.7 C)  TempSrc: Oral Oral  Oral  SpO2:  97% 100% 97%  Weight:      Height:  Intake/Output Summary (Last 24 hours) at 08/01/16 0915 Last data filed at 08/01/16 0600  Gross per 24 hour  Intake              230 ml  Output              250 ml  Net              -20 ml   Weight change:  Exam:   General:  Pt is alert, follows  commands appropriately, not in acute distress  HEENT: No icterus, No thrush, No neck mass, [place]/AT  Cardiovascular: RRR, S1/S2, no rubs, no gallops  Respiratory: Bilateral scattered rhonchi with wheezing.   Abdomen: Soft/+BS, non tender, non distended, no guarding  Extremities: Trace LE edema, No lymphangitis, No petechiae, No rashes, no synovitis   Data Reviewed: I have personally reviewed following labs and imaging studies Basic Metabolic Panel:  Recent Labs Lab 07/31/16 1311  NA 139  K 3.6  CL 107  CO2 23  GLUCOSE 135*  BUN 25*  CREATININE 1.00  CALCIUM 8.9   Liver Function Tests: No results for input(s): AST, ALT, ALKPHOS, BILITOT, PROT, ALBUMIN in the last 168 hours. No results for input(s): LIPASE, AMYLASE in the last 168 hours. No results for input(s): AMMONIA in the last 168 hours. Coagulation Profile: No results for input(s): INR, PROTIME in the last 168 hours. CBC:  Recent Labs Lab 07/31/16 1311  WBC 8.0  HGB 12.5  HCT 39.7  MCV 92.8  PLT 286   Cardiac Enzymes:  Recent Labs Lab 07/31/16 1744  TROPONINI 3.21*   BNP: Invalid input(s): POCBNP CBG: No results for input(s): GLUCAP in the last 168 hours. HbA1C: No results for input(s): HGBA1C in the last 72 hours. Urine analysis:    Component Value Date/Time   COLORURINE YELLOW 07/08/2014 1745   APPEARANCEUR CLEAR 07/08/2014 1745   LABSPEC 1.017 07/08/2014 1745   PHURINE 5.5 07/08/2014 1745   GLUCOSEU NEGATIVE 07/08/2014 1745   HGBUR TRACE (A) 07/08/2014 1745   BILIRUBINUR negative 07/14/2016 1530   KETONESUR trace (5) (A) 07/14/2016 1530   KETONESUR NEGATIVE 07/08/2014 1745   PROTEINUR =30 (A) 07/14/2016 1530   PROTEINUR NEGATIVE 07/08/2014 1745   UROBILINOGEN 2.0 07/14/2016 1530   UROBILINOGEN 1.0 07/08/2014 1745   NITRITE Positive (A) 07/14/2016 1530   NITRITE POSITIVE (A) 07/08/2014 1745   LEUKOCYTESUR Trace (A) 07/14/2016 1530   Sepsis  Labs: @LABRCNTIP (procalcitonin:4,lacticidven:4) )No results found for this or any previous visit (from the past 240 hour(s)).   Scheduled Meds: . azithromycin  500 mg Intravenous Q24H  . cefTRIAXone (ROCEPHIN)  IV  1 g Intravenous Q24H   Continuous Infusions: . sodium chloride      Procedures/Studies: Dg Chest 2 View  Result Date: 07/31/2016 CLINICAL DATA:  Patient reports mid chest pain that started this morning, wheezing sounds due to smoking. HX smoker, cracked ribs EXAM: CHEST  2 VIEW COMPARISON:  07/14/2016 FINDINGS: Heart margins are obscured by opacity in the left lower lobe. There is parenchymal opacity as well as pleural effusion. Small right pleural effusions also present. Perihilar bronchitic changes are again noted. No pulmonary edema. IMPRESSION: 1. Left lower lobe infiltrate. 2. Bilateral pleural effusions. Electronically Signed   By: Norva Pavlov M.D.   On: 07/31/2016 13:35   Dg Ribs Unilateral W/chest Left  Result Date: 07/14/2016 CLINICAL DATA:  Left-sided chest pain EXAM: LEFT RIBS AND CHEST - 3+ VIEW COMPARISON:  Chest x-ray of 07/08/2014 FINDINGS: No definite pneumonia  is seen. There is minimal atelectasis or scarring at the left lung base, and a tiny left pleural effusion cannot be excluded. No right pleural effusion is seen. Mediastinal and hilar contours are unremarkable. The heart is mildly enlarged and stable. Left rib detail film show old healed left rib fractures involving the left fifth and sixth ribs. No acute left rib fracture is seen. IMPRESSION: 1. Minimal scarring or atelectasis at the left lung base. Cannot exclude a tiny left pleural effusion. 2. Negative left rib detail for acute fracture. Electronically Signed   By: Dwyane Dee M.D.   On: 07/14/2016 15:07    Kristol Almanzar, DO  [facility] Pager [phone]  If 7PM-7AM, please contact night-coverage [email or web address] Password [email or web address] 08/01/2016, 9:15 AM   LOS: 0 days

## 2016-08-02 NOTE — Progress Notes (Signed)
Pt prepared for d/c to residential hospice [facility]). IV d/c'd. Skin intact except as charted in most recent assessments. Vitals are stable. [facility]. Pt to be transported by ambulance service.  Peri MarisAndrew Sandar Krinke, MBA, BSN, RN

## 2016-08-02 NOTE — Progress Notes (Signed)
Transport arrived to [facility].  Patient's family requested that I do not give the patient any medication for the [facility].  Peri MarisAndrew Sayre Witherington, MBA, BSN, RN

## 2016-08-02 NOTE — Progress Notes (Addendum)
11am [facility] can accept patient today- dtrs in agreement  [facility] Anticipated discharge date: 9/4 Family notified: Delaney Meigsamara Transportation by Sharin MonsPTAR- called at 11:10am  CSW signing off.  Burna SisJenna H. Tavone Caesar, LCSWA Clinical Social Worker [phone]    8:45am CSW spoke with pt dtr Delaney Meigsamara to confirm choice for pt transition to hospice- Delaney Meigsamara states that the pt did not want heroic efforts and that hospice would be what she wants.  [facility]am[facility] several family members/friends have been before and were treated well.  [facility] are reviewing clinicals for possible admission today.    CSW will continue to follow

## 2016-08-05 LAB — CULTURE, BLOOD (ROUTINE X 2)
CULTURE: NO GROWTH
CULTURE: NO GROWTH

## 2016-08-29 DEATH — deceased
